# Patient Record
Sex: Male | Born: 1940 | Race: White | Hispanic: Yes | Marital: Married | Smoking: Never smoker
Health system: Southern US, Community
[De-identification: ages and names within clinical notes are randomized; demographics above are authoritative.]

## PROBLEM LIST (undated history)

## (undated) DIAGNOSIS — J387 Other diseases of larynx: Secondary | ICD-10-CM

## (undated) DIAGNOSIS — J42 Unspecified chronic bronchitis: Secondary | ICD-10-CM

## (undated) DIAGNOSIS — I10 Essential (primary) hypertension: Secondary | ICD-10-CM

## (undated) DIAGNOSIS — F329 Major depressive disorder, single episode, unspecified: Secondary | ICD-10-CM

## (undated) DIAGNOSIS — G479 Sleep disorder, unspecified: Secondary | ICD-10-CM

## (undated) DIAGNOSIS — E785 Hyperlipidemia, unspecified: Secondary | ICD-10-CM

## (undated) DIAGNOSIS — K219 Gastro-esophageal reflux disease without esophagitis: Secondary | ICD-10-CM

## (undated) HISTORY — DX: Essential (primary) hypertension: I10

## (undated) HISTORY — DX: Gastro-esophageal reflux disease without esophagitis: K21.9

## (undated) HISTORY — PX: PROSTATE SURGERY: SHX751

## (undated) HISTORY — DX: Hyperlipidemia, unspecified: E78.5

## (undated) HISTORY — DX: Sleep disorder, unspecified: G47.9

## (undated) HISTORY — DX: Unspecified chronic bronchitis: J42

## (undated) HISTORY — PX: KNEE ARTHROSCOPY: SUR90

## (undated) HISTORY — PX: SKIN CANCER EXCISION: SHX779

## (undated) HISTORY — PX: CERVICAL DISCECTOMY: SHX98

## (undated) HISTORY — DX: Other diseases of larynx: J38.7

## (undated) HISTORY — PX: BREAST CYST EXCISION: SHX579

## (undated) HISTORY — DX: Major depressive disorder, single episode, unspecified: F32.9

---

## 1959-05-09 DIAGNOSIS — F32A Depression, unspecified: Secondary | ICD-10-CM

## 1959-05-09 HISTORY — DX: Depression, unspecified: F32.A

## 1960-05-08 HISTORY — PX: TONSILLECTOMY AND ADENOIDECTOMY: SUR1326

## 1960-05-08 HISTORY — PX: ELBOW ARTHROPLASTY: SHX928

## 1996-05-08 HISTORY — PX: NEPHRECTOMY: SHX65

## 2013-06-03 ENCOUNTER — Encounter: Payer: Self-pay | Admitting: Otolaryngology

## 2013-06-08 ENCOUNTER — Encounter: Payer: Self-pay | Admitting: Otolaryngology

## 2013-07-06 ENCOUNTER — Encounter: Payer: Self-pay | Admitting: Otolaryngology

## 2013-07-30 ENCOUNTER — Encounter: Payer: Self-pay | Admitting: Internal Medicine

## 2013-07-30 ENCOUNTER — Ambulatory Visit (INDEPENDENT_AMBULATORY_CARE_PROVIDER_SITE_OTHER): Payer: Self-pay | Admitting: Internal Medicine

## 2013-07-30 VITALS — BP 120/80 | HR 96 | Temp 98.1°F | Ht 68.5 in | Wt 138.0 lb

## 2013-07-30 DIAGNOSIS — E785 Hyperlipidemia, unspecified: Secondary | ICD-10-CM | POA: Insufficient documentation

## 2013-07-30 DIAGNOSIS — K219 Gastro-esophageal reflux disease without esophagitis: Secondary | ICD-10-CM | POA: Insufficient documentation

## 2013-07-30 DIAGNOSIS — R21 Rash and other nonspecific skin eruption: Secondary | ICD-10-CM | POA: Insufficient documentation

## 2013-07-30 DIAGNOSIS — F3289 Other specified depressive episodes: Secondary | ICD-10-CM

## 2013-07-30 DIAGNOSIS — F329 Major depressive disorder, single episode, unspecified: Secondary | ICD-10-CM

## 2013-07-30 DIAGNOSIS — I10 Essential (primary) hypertension: Secondary | ICD-10-CM

## 2013-07-30 DIAGNOSIS — F32A Depression, unspecified: Secondary | ICD-10-CM

## 2013-07-30 DIAGNOSIS — G479 Sleep disorder, unspecified: Secondary | ICD-10-CM | POA: Insufficient documentation

## 2013-07-30 DIAGNOSIS — J42 Unspecified chronic bronchitis: Secondary | ICD-10-CM | POA: Insufficient documentation

## 2013-07-30 MED ORDER — TRIAMCINOLONE ACETONIDE 0.1 % EX CREA: 1.0000 "application " | TOPICAL_CREAM | Freq: Two times a day (BID) | CUTANEOUS | Status: DC | PRN

## 2013-07-30 MED ORDER — TRAZODONE HCL 50 MG PO TABS: 50.0000 mg | ORAL_TABLET | Freq: Every day | ORAL | Status: DC

## 2013-07-30 NOTE — Assessment & Plan Note (Signed)
BP Readings from Last 3 Encounters:  07/30/13 120/80   Repeat 128/88 Will not make any changes

## 2013-07-30 NOTE — Assessment & Plan Note (Addendum)
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

## 2013-07-30 NOTE — Assessment & Plan Note (Addendum)
Has grieving, also just moved back to [place] after living in [place] for 30 years or so Semi retirement Daughter is concerned but he doesn't want meds Sounds like he has some degree of psychomotor agitation

## 2013-07-30 NOTE — Progress Notes (Signed)
Subjective:    Patient ID: Bradley Ellis, male    DOB: September 04, 1940, 73 y.o.   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 Only started with diarrhea in the past couple of days Thinks it is reflux related Was taking prevacid 30 bid in [place] Breathing is fine Trying to start walking now  Has been on lorazepam for many years Chronic sleep problems Arm spasms as well-- per daughter. No tremors though. Was given steroids in past --may have helped. Had conduction tests--?nerve disorder  Has had rash on left arm and leg since back in [place] in December  No current outpatient prescriptions on file prior to visit.   No current facility-administered medications on file prior to visit.    Allergies  Allergen Reactions  . Sulfa Antibiotics Rash    Past Medical History  Diagnosis Date  . Sleep disorder   . Hypertension   . GERD (gastroesophageal reflux disease)   . Chronic bronchitis     not from smoking  . Depression 1961    no major exacerbations since  . Hyperlipidemia     Past Surgical History  Procedure Laterality Date  . Nephrectomy Right 1998    for cancer. Done at [facility]. No other Rx  . Cervical discectomy  [id number]  . Knee arthroscopy Left     2 arthroscopy. Another surgery due to infection (cactus needle)  . Breast cyst excision Bilateral     also in groin and by rectum at other times  . Tonsillectomy and adenoidectomy  1962  . Elbow arthroplasty Left 1962  . Skin cancer excision      several in various places    Family History  Problem Relation Age of Onset  . Cancer Mother     breast cancer  . Heart disease Sister   . Stroke Sister   . Diabetes Sister   . Diabetes Maternal Grandmother     History   Social History  . Marital Status: Widowed    Spouse Name: N/A    Number of Children: 2  . Years of Education: N/A   Occupational History  . [employer]    Social History Main Topics  . Smoking status: Never Smoker   . Smokeless tobacco: Never Used  . Alcohol Use: No  . Drug Use: No  . Sexual Activity: Not on file   Other Topics Concern  . 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       Weight rising since coming back to [place]  Respiratory: Negative for shortness of breath.   Cardiovascular: Positive for chest pain. Negative for palpitations.  Gastrointestinal: Positive for abdominal pain and diarrhea. Negative for nausea, vomiting and blood in stool.       Started with GI symptoms last night  Genitourinary: Positive for difficulty urinating. Negative for dysuria.       Slow initiation  Musculoskeletal: Positive for arthralgias and back pain.  Back surgery in past Occasional knee or wrist pain  Neurological: Negative for dizziness, syncope and light-headedness.  Psychiatric/Behavioral: Positive for sleep disturbance and dysphoric mood. The patient is nervous/anxious.        Mourning his wife-- October 2014       Objective:   Physical Exam  Constitutional: He appears well-developed. No distress.  HENT:  Mouth/Throat: Oropharynx is clear and moist. No oropharyngeal exudate.  Neck: Normal range of motion. Neck supple. No thyromegaly present.  Cardiovascular: Normal rate, regular rhythm, normal heart sounds and intact distal pulses.  Exam reveals no gallop.   No murmur heard. Pulmonary/Chest: Effort normal and breath sounds normal. No respiratory distress. He has  no wheezes. He has no rales.  Abdominal: Soft. There is no tenderness.  Lymphadenopathy:    He has no cervical adenopathy.  Skin:  Blanching mildly tender red rash on left arm and leg--small area overall  Psychiatric:  Mood is neutral Affect is flat Normal appearance          Assessment & Plan:

## 2013-07-30 NOTE — Progress Notes (Signed)
Pre visit review using our clinic review tool, if applicable. No additional management support is needed unless otherwise documented below in the visit note. 

## 2013-07-30 NOTE — Assessment & Plan Note (Signed)
Doesn't look infectious Will try triamcinolone May need derm eval (plans to go when back in [place])

## 2013-07-31 ENCOUNTER — Telehealth: Payer: Self-pay | Admitting: Internal Medicine

## 2013-07-31 NOTE — Telephone Encounter (Signed)
emmi report mailed to patient ° °

## 2013-08-04 ENCOUNTER — Ambulatory Visit (INDEPENDENT_AMBULATORY_CARE_PROVIDER_SITE_OTHER): Payer: Self-pay | Admitting: Internal Medicine

## 2013-08-04 ENCOUNTER — Encounter: Payer: Self-pay | Admitting: Internal Medicine

## 2013-08-04 VITALS — BP 146/100 | HR 98 | Temp 98.3°F | Wt 138.0 lb

## 2013-08-04 DIAGNOSIS — I10 Essential (primary) hypertension: Secondary | ICD-10-CM

## 2013-08-04 MED ORDER — HYDROCHLOROTHIAZIDE 12.5 MG PO CAPS: 12.5000 mg | ORAL_CAPSULE | Freq: Every day | ORAL | Status: DC

## 2013-08-04 NOTE — Assessment & Plan Note (Signed)
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

## 2013-08-04 NOTE — Patient Instructions (Signed)
Please add the hydrochlorothiazide (HCTZ) and continue the other medications. If you check your blood pressure, write down the numbers and bring it with you next time

## 2013-08-04 NOTE — Progress Notes (Signed)
Pre visit review using our clinic review tool, if applicable. No additional management support is needed unless otherwise documented below in the visit note. 

## 2013-08-04 NOTE — Progress Notes (Signed)
Subjective:    Patient ID: Bradley Ellis, male    DOB: May 16, 1940, 73 y.o.   MRN: [id number]  HPI Bradley Ellis to Fire station today Felt a little dizzy and wanted BP checked BP was 170/90 there He called here and got appt  No chest pain Some headache--bandlike across forehead (still some residual) No SOB No change in slight edema  Current Outpatient Prescriptions on File Prior to Visit  Medication Sig Dispense Refill  . amLODipine (NORVASC) 5 MG tablet Take 5 mg by mouth daily.       Marland Kitchen lisinopril (PRINIVIL,ZESTRIL) 40 MG tablet Take 40 mg by mouth daily.       Marland Kitchen omeprazole (PRILOSEC) 40 MG capsule Take 40 mg by mouth daily.       . traZODone (DESYREL) 50 MG tablet Take 1-2 tablets (50-100 mg total) by mouth at bedtime.  60 tablet  11  . triamcinolone cream (KENALOG) 0.1 % Apply 1 application topically 2 (two) times daily as needed.  30 g  0   No current facility-administered medications on file prior to visit.    Allergies  Allergen Reactions  . Sulfa Antibiotics Rash    Past Medical History  Diagnosis Date  . Sleep disorder   . Hypertension   . GERD (gastroesophageal reflux disease)   . Chronic bronchitis     not from smoking  . Depression 1961    no major exacerbations since  . Hyperlipidemia   . Laryngeal nodule     benign    Past Surgical History  Procedure Laterality Date  . Nephrectomy Right 1998    for cancer. Done at [facility]. No other Rx  . Cervical discectomy  [id number]  . Knee arthroscopy Left     2 arthroscopy. Another surgery due to infection (cactus needle)  . Breast cyst excision Bilateral     also in groin and by rectum at other times  . Tonsillectomy and adenoidectomy  1962  . Elbow arthroplasty Left 1962  . Skin cancer excision      several in various places    Family History  Problem Relation Age of Onset  . Cancer Mother     breast cancer  . Heart disease Sister   . Stroke Sister   . Diabetes Sister   . Diabetes Maternal  Grandmother     History   Social History  . Marital Status: Widowed    Spouse Name: N/A    Number of Children: 2  . Years of Education: N/A   Occupational History  . [employer]    Social History Main Topics  . Smoking status: Never Smoker   . Smokeless tobacco: Never Used  . Alcohol Use: No  . Drug Use: No  . Sexual Activity: Not on file   Other Topics Concern  . 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 No distress.  Neck: Normal range of motion. Neck supple. No thyromegaly present.  Cardiovascular: Normal rate, regular rhythm and normal heart sounds.  Exam reveals no gallop.   No murmur heard. Pulmonary/Chest: Effort normal and breath sounds normal. No respiratory distress. He has no wheezes. He has no rales.  Musculoskeletal: He exhibits no edema.  Lymphadenopathy:    He has no cervical adenopathy.          Assessment & Plan:

## 2013-08-05 ENCOUNTER — Other Ambulatory Visit: Payer: Self-pay

## 2013-08-05 MED ORDER — AMLODIPINE BESYLATE 5 MG PO TABS: 5.0000 mg | ORAL_TABLET | Freq: Every day | ORAL | Status: DC

## 2013-08-05 MED ORDER — TRIAMCINOLONE ACETONIDE 0.1 % EX CREA: 1.0000 "application " | TOPICAL_CREAM | Freq: Two times a day (BID) | CUTANEOUS | Status: DC | PRN

## 2013-08-05 MED ORDER — TRAZODONE HCL 50 MG PO TABS: 50.0000 mg | ORAL_TABLET | Freq: Every day | ORAL | Status: DC

## 2013-08-05 MED ORDER — LISINOPRIL 40 MG PO TABS: 40.0000 mg | ORAL_TABLET | Freq: Every day | ORAL | Status: DC

## 2013-08-05 MED ORDER — OMEPRAZOLE 40 MG PO CPDR: 40.0000 mg | DELAYED_RELEASE_CAPSULE | Freq: Every day | ORAL | Status: DC

## 2013-08-05 NOTE — Telephone Encounter (Signed)
[place] said pt is going out of country and request 90 day supply on all meds, amlodipine,lisinopril and omeprazole has previously been prescribed by [facility] office. Please advise.

## 2013-09-10 ENCOUNTER — Ambulatory Visit: Payer: Self-pay | Admitting: Internal Medicine

## 2013-09-11 ENCOUNTER — Ambulatory Visit (INDEPENDENT_AMBULATORY_CARE_PROVIDER_SITE_OTHER): Payer: Self-pay | Admitting: Internal Medicine

## 2013-09-11 ENCOUNTER — Encounter: Payer: Self-pay | Admitting: Internal Medicine

## 2013-09-11 ENCOUNTER — Ambulatory Visit (INDEPENDENT_AMBULATORY_CARE_PROVIDER_SITE_OTHER)
Admission: RE | Admit: 2013-09-11 | Discharge: 2013-09-11 | Disposition: A | Discharge status: Home / Self Care | Payer: Self-pay | Source: Ambulatory Visit | Attending: Internal Medicine | Admitting: Internal Medicine

## 2013-09-11 VITALS — BP 138/80 | HR 83 | Temp 98.4°F | Wt 135.0 lb

## 2013-09-11 DIAGNOSIS — I10 Essential (primary) hypertension: Secondary | ICD-10-CM

## 2013-09-11 DIAGNOSIS — J42 Unspecified chronic bronchitis: Secondary | ICD-10-CM

## 2013-09-11 MED ORDER — AMOXICILLIN 500 MG PO TABS: 1000.0000 mg | ORAL_TABLET | Freq: Two times a day (BID) | ORAL | Status: DC

## 2013-09-11 NOTE — Assessment & Plan Note (Addendum)
Exacerbation now CXR is normal Will treat with amoxil

## 2013-09-11 NOTE — Progress Notes (Signed)
Pre visit review using our clinic review tool, if applicable. No additional management support is needed unless otherwise documented below in the visit note. 

## 2013-09-11 NOTE — Progress Notes (Signed)
Subjective:    Patient ID: Bradley Ellis, male    DOB: 1940/11/08, 73 y.o.   MRN: [id number]  HPI Doing well No problems with the HCTZ Hasn't really been monitoring BP Was okay when back in [place]--- still goes 3 weeks at a time 4 times per year Works at several churches locally  Started with cold 4-5 days ago since return from [place] No fever Some chills without sweats Some SOB--hears rattling in chest Has had some chest pain-- relates to current illness Some sore throat No ear pain No head congestion or drainage  Current Outpatient Prescriptions on File Prior to Visit  Medication Sig Dispense Refill  . amLODipine (NORVASC) 5 MG tablet Take 1 tablet (5 mg total) by mouth daily.  90 tablet  3  . hydrochlorothiazide (MICROZIDE) 12.5 MG capsule Take 1 capsule (12.5 mg total) by mouth daily.  90 capsule  3  . lisinopril (PRINIVIL,ZESTRIL) 40 MG tablet Take 1 tablet (40 mg total) by mouth daily.  90 tablet  3  . omeprazole (PRILOSEC) 40 MG capsule Take 1 capsule (40 mg total) by mouth daily.  90 capsule  3  . traZODone (DESYREL) 50 MG tablet Take 1-2 tablets (50-100 mg total) by mouth at bedtime.  60 tablet  11  . triamcinolone cream (KENALOG) 0.1 % Apply 1 application topically 2 (two) times daily as needed.  30 g  0   No current facility-administered medications on file prior to visit.    Allergies  Allergen Reactions  . Sulfa Antibiotics Rash    Past Medical History  Diagnosis Date  . Sleep disorder   . Hypertension   . GERD (gastroesophageal reflux disease)   . Chronic bronchitis     not from smoking  . Depression 1961    no major exacerbations since  . Hyperlipidemia   . Laryngeal nodule     benign    Past Surgical History  Procedure Laterality Date  . Nephrectomy Right 1998    for cancer. Done at [facility]. No other Rx  . Cervical discectomy  [id number]  . Knee arthroscopy Left     2 arthroscopy. Another surgery due to infection (cactus needle)  .  Breast cyst excision Bilateral     also in groin and by rectum at other times  . Tonsillectomy and adenoidectomy  1962  . Elbow arthroplasty Left 1962  . Skin cancer excision      several in various places    Family History  Problem Relation Age of Onset  . Cancer Mother     breast cancer  . Heart disease Sister   . Stroke Sister   . Diabetes Sister   . Diabetes Maternal Grandmother     History   Social History  . Marital Status: Widowed    Spouse Name: N/A    Number of Children: 2  . Years of Education: N/A   Occupational History  . [employer]    Social History Main Topics  . Smoking status: Never Smoker   . Smokeless tobacco: Never Used  . Alcohol Use: No  . Drug Use: No  . Sexual Activity: Not on file   Other Topics Concern  . 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 Some loose stools Not eating  well-- a little better today    Objective:   Physical Exam  Constitutional: He appears well-developed and well-nourished. No distress.  Coarse cough  HENT:  No sinus tenderness TMs normal Mild nasal congestion Injection of pharynx without exudate  Neck: Normal range of motion. Neck supple. No thyromegaly present.  Cardiovascular: Normal rate, regular rhythm and normal heart sounds.  Exam reveals no gallop.   No murmur heard. Pulmonary/Chest: Effort normal. No respiratory distress. He has no wheezes.  No dullness Mild bibasilar crackles  Musculoskeletal: He exhibits no edema and no tenderness.  Lymphadenopathy:    He has no cervical adenopathy.          Assessment & Plan:

## 2013-09-11 NOTE — Assessment & Plan Note (Signed)
BP Readings from Last 3 Encounters:  09/11/13 138/80  08/04/13 146/100  07/30/13 120/80   Better control Will check labs No changes

## 2013-09-12 ENCOUNTER — Encounter: Payer: Self-pay | Admitting: *Deleted

## 2013-09-12 LAB — BASIC METABOLIC PANEL
BUN: 11 mg/dL (ref 6–23)
CO2: 30 meq/L (ref 19–32)
CREATININE: 1.3 mg/dL (ref 0.4–1.5)
Calcium: 8.6 mg/dL (ref 8.4–10.5)
Chloride: 104 mEq/L (ref 96–112)
GFR: 57.23 mL/min — ABNORMAL LOW (ref >60.00)
Glucose, Bld: 72 mg/dL (ref 70–99)
POTASSIUM: 4.2 meq/L (ref 3.5–5.1)
Sodium: 142 mEq/L (ref 135–145)

## 2013-09-15 ENCOUNTER — Telehealth: Payer: Self-pay

## 2013-09-15 MED ORDER — BECLOMETHASONE DIPROPIONATE 40 MCG/ACT IN AERS: 1.0000 | INHALATION_SPRAY | Freq: Two times a day (BID) | RESPIRATORY_TRACT | Status: DC

## 2013-09-15 NOTE — Telephone Encounter (Signed)
rx sent to pharmacy by e-script Spoke with patient and advised results  he has never used albuterol

## 2013-09-15 NOTE — Telephone Encounter (Signed)
Okay to send Qvar 40 1 inhalation bid---use with spacer (can get OTC) and rinse after #1 x 11  Ask if he has used albuterol If yes, can send #1 x 1 2 inhalations tid prn

## 2013-09-15 NOTE — Telephone Encounter (Signed)
Pt left v/m; pt was seen on 09/11/13 and pt was calling back, bronchitis is no better; pt said x 2 pt could not get his breath on 09/13/13 and 09/14/13 and pt used someone elses inhaler and that helped pt to breathe. Pt request inhaler sent to [facility] [address]. Spoke with pt and inhaler that pt used was Qvar 40 mg. Pt is still taking antibiotic and pt feels little better today. Pt wants cb when inhaler called in.

## 2013-09-22 ENCOUNTER — Telehealth: Payer: Self-pay

## 2013-09-22 NOTE — Telephone Encounter (Signed)
Spoke with patient and advised results   

## 2013-09-22 NOTE — Telephone Encounter (Signed)
Pt received letter and request further clarification of what mild hyperinflation is suspicious of COPD means. Pt request cb.

## 2013-09-22 NOTE — Telephone Encounter (Signed)
It means that the amount of air in his lungs is more than expected. That can happen with any form of chronic lung problems (like his chronic bronchitis)

## 2014-02-04 ENCOUNTER — Emergency Department: Payer: Self-pay | Admitting: Emergency Medicine

## 2014-02-04 ENCOUNTER — Telehealth: Payer: Self-pay

## 2014-02-04 NOTE — Telephone Encounter (Signed)
Pt is going out of town on 02/05/14 and wants refills on all meds; advised pt [facility] should have available refills and pt will contact pharmacy.

## 2014-09-14 ENCOUNTER — Encounter: Payer: Self-pay | Admitting: Internal Medicine

## 2014-09-14 DIAGNOSIS — Z0289 Encounter for other administrative examinations: Secondary | ICD-10-CM

## 2014-11-18 DIAGNOSIS — F325 Major depressive disorder, single episode, in full remission: Secondary | ICD-10-CM | POA: Insufficient documentation

## 2014-11-19 DIAGNOSIS — M79604 Pain in right leg: Secondary | ICD-10-CM | POA: Insufficient documentation

## 2014-11-19 DIAGNOSIS — M79605 Pain in left leg: Secondary | ICD-10-CM | POA: Insufficient documentation

## 2014-11-23 ENCOUNTER — Other Ambulatory Visit: Payer: Self-pay | Admitting: Internal Medicine

## 2014-11-24 ENCOUNTER — Other Ambulatory Visit: Payer: Self-pay | Admitting: *Deleted

## 2014-11-24 MED ORDER — OMEPRAZOLE 40 MG PO CPDR
40.0000 mg | DELAYED_RELEASE_CAPSULE | Freq: Every day | ORAL | Status: DC
Start: 2014-11-24 — End: 2018-04-09

## 2014-11-24 NOTE — Telephone Encounter (Signed)
Faxed refill request. Patient not seen since 09/11/13.  No showed for appt on 09/12/14.  Last Filled:    90 capsule 3 RF on 08/05/2013  Please advise.

## 2014-11-24 NOTE — Telephone Encounter (Signed)
rx sent to pharmacy by e-script  

## 2017-01-15 DIAGNOSIS — F5101 Primary insomnia: Secondary | ICD-10-CM | POA: Diagnosis not present

## 2017-01-15 DIAGNOSIS — N529 Male erectile dysfunction, unspecified: Secondary | ICD-10-CM | POA: Diagnosis not present

## 2017-02-14 DIAGNOSIS — N529 Male erectile dysfunction, unspecified: Secondary | ICD-10-CM | POA: Diagnosis not present

## 2017-03-06 DIAGNOSIS — H6120 Impacted cerumen, unspecified ear: Secondary | ICD-10-CM | POA: Diagnosis not present

## 2017-03-13 DIAGNOSIS — H2512 Age-related nuclear cataract, left eye: Secondary | ICD-10-CM | POA: Diagnosis not present

## 2017-03-13 DIAGNOSIS — H2511 Age-related nuclear cataract, right eye: Secondary | ICD-10-CM | POA: Diagnosis not present

## 2017-03-13 DIAGNOSIS — H02889 Meibomian gland dysfunction of unspecified eye, unspecified eyelid: Secondary | ICD-10-CM | POA: Diagnosis not present

## 2017-03-13 DIAGNOSIS — H02831 Dermatochalasis of right upper eyelid: Secondary | ICD-10-CM | POA: Diagnosis not present

## 2017-03-14 DIAGNOSIS — R351 Nocturia: Secondary | ICD-10-CM | POA: Diagnosis not present

## 2017-03-14 DIAGNOSIS — N401 Enlarged prostate with lower urinary tract symptoms: Secondary | ICD-10-CM | POA: Diagnosis not present

## 2017-03-14 DIAGNOSIS — M6281 Muscle weakness (generalized): Secondary | ICD-10-CM | POA: Diagnosis not present

## 2017-03-14 DIAGNOSIS — R531 Weakness: Secondary | ICD-10-CM | POA: Diagnosis not present

## 2017-03-15 DIAGNOSIS — H2512 Age-related nuclear cataract, left eye: Secondary | ICD-10-CM | POA: Diagnosis not present

## 2017-03-21 DIAGNOSIS — H2511 Age-related nuclear cataract, right eye: Secondary | ICD-10-CM | POA: Diagnosis not present

## 2017-03-21 DIAGNOSIS — H25811 Combined forms of age-related cataract, right eye: Secondary | ICD-10-CM | POA: Diagnosis not present

## 2018-02-13 DIAGNOSIS — I10 Essential (primary) hypertension: Secondary | ICD-10-CM | POA: Diagnosis not present

## 2018-02-13 DIAGNOSIS — J069 Acute upper respiratory infection, unspecified: Secondary | ICD-10-CM | POA: Diagnosis not present

## 2018-02-13 DIAGNOSIS — K219 Gastro-esophageal reflux disease without esophagitis: Secondary | ICD-10-CM | POA: Diagnosis not present

## 2018-02-22 DIAGNOSIS — Z23 Encounter for immunization: Secondary | ICD-10-CM | POA: Diagnosis not present

## 2018-03-15 DIAGNOSIS — G4700 Insomnia, unspecified: Secondary | ICD-10-CM | POA: Diagnosis not present

## 2018-03-15 DIAGNOSIS — I10 Essential (primary) hypertension: Secondary | ICD-10-CM | POA: Diagnosis not present

## 2018-03-15 DIAGNOSIS — Z760 Encounter for issue of repeat prescription: Secondary | ICD-10-CM | POA: Diagnosis not present

## 2018-03-20 DIAGNOSIS — G4700 Insomnia, unspecified: Secondary | ICD-10-CM | POA: Diagnosis not present

## 2018-03-20 DIAGNOSIS — I10 Essential (primary) hypertension: Secondary | ICD-10-CM | POA: Diagnosis not present

## 2018-03-20 DIAGNOSIS — G589 Mononeuropathy, unspecified: Secondary | ICD-10-CM | POA: Diagnosis not present

## 2018-03-28 DIAGNOSIS — I10 Essential (primary) hypertension: Secondary | ICD-10-CM | POA: Diagnosis not present

## 2018-03-28 DIAGNOSIS — G5793 Unspecified mononeuropathy of bilateral lower limbs: Secondary | ICD-10-CM | POA: Diagnosis not present

## 2018-03-28 DIAGNOSIS — Z760 Encounter for issue of repeat prescription: Secondary | ICD-10-CM | POA: Diagnosis not present

## 2018-04-01 DIAGNOSIS — M545 Low back pain: Secondary | ICD-10-CM | POA: Diagnosis not present

## 2018-04-01 DIAGNOSIS — Z5181 Encounter for therapeutic drug level monitoring: Secondary | ICD-10-CM | POA: Diagnosis not present

## 2018-04-03 DIAGNOSIS — I714 Abdominal aortic aneurysm, without rupture, unspecified: Secondary | ICD-10-CM | POA: Insufficient documentation

## 2018-04-03 DIAGNOSIS — Z905 Acquired absence of kidney: Secondary | ICD-10-CM | POA: Diagnosis not present

## 2018-04-03 DIAGNOSIS — R109 Unspecified abdominal pain: Secondary | ICD-10-CM | POA: Diagnosis not present

## 2018-04-03 DIAGNOSIS — I318 Other specified diseases of pericardium: Secondary | ICD-10-CM | POA: Diagnosis not present

## 2018-04-03 DIAGNOSIS — R1013 Epigastric pain: Secondary | ICD-10-CM | POA: Diagnosis not present

## 2018-04-03 DIAGNOSIS — K5730 Diverticulosis of large intestine without perforation or abscess without bleeding: Secondary | ICD-10-CM | POA: Diagnosis not present

## 2018-04-03 DIAGNOSIS — N2889 Other specified disorders of kidney and ureter: Secondary | ICD-10-CM | POA: Diagnosis not present

## 2018-04-03 DIAGNOSIS — K859 Acute pancreatitis without necrosis or infection, unspecified: Secondary | ICD-10-CM | POA: Diagnosis not present

## 2018-04-03 DIAGNOSIS — I480 Paroxysmal atrial fibrillation: Secondary | ICD-10-CM | POA: Insufficient documentation

## 2018-04-03 DIAGNOSIS — I4510 Unspecified right bundle-branch block: Secondary | ICD-10-CM | POA: Diagnosis not present

## 2018-04-03 DIAGNOSIS — R1314 Dysphagia, pharyngoesophageal phase: Secondary | ICD-10-CM | POA: Diagnosis not present

## 2018-04-03 DIAGNOSIS — I4891 Unspecified atrial fibrillation: Secondary | ICD-10-CM | POA: Diagnosis not present

## 2018-04-03 DIAGNOSIS — K269 Duodenal ulcer, unspecified as acute or chronic, without hemorrhage or perforation: Secondary | ICD-10-CM | POA: Diagnosis not present

## 2018-04-03 DIAGNOSIS — K901 Tropical sprue: Secondary | ICD-10-CM | POA: Diagnosis not present

## 2018-04-03 DIAGNOSIS — I4892 Unspecified atrial flutter: Secondary | ICD-10-CM | POA: Diagnosis not present

## 2018-04-03 DIAGNOSIS — I445 Left posterior fascicular block: Secondary | ICD-10-CM | POA: Diagnosis not present

## 2018-04-03 DIAGNOSIS — I878 Other specified disorders of veins: Secondary | ICD-10-CM | POA: Diagnosis not present

## 2018-04-03 DIAGNOSIS — I700 Atherosclerosis of aorta: Secondary | ICD-10-CM | POA: Diagnosis not present

## 2018-04-03 DIAGNOSIS — K222 Esophageal obstruction: Secondary | ICD-10-CM | POA: Diagnosis not present

## 2018-04-04 DIAGNOSIS — Z905 Acquired absence of kidney: Secondary | ICD-10-CM | POA: Diagnosis not present

## 2018-04-04 DIAGNOSIS — I10 Essential (primary) hypertension: Secondary | ICD-10-CM | POA: Diagnosis not present

## 2018-04-04 DIAGNOSIS — I714 Abdominal aortic aneurysm, without rupture: Secondary | ICD-10-CM | POA: Diagnosis not present

## 2018-04-04 DIAGNOSIS — Z8673 Personal history of transient ischemic attack (TIA), and cerebral infarction without residual deficits: Secondary | ICD-10-CM | POA: Diagnosis not present

## 2018-04-04 DIAGNOSIS — I319 Disease of pericardium, unspecified: Secondary | ICD-10-CM | POA: Diagnosis not present

## 2018-04-04 DIAGNOSIS — R634 Abnormal weight loss: Secondary | ICD-10-CM | POA: Diagnosis not present

## 2018-04-04 DIAGNOSIS — R1013 Epigastric pain: Secondary | ICD-10-CM | POA: Diagnosis not present

## 2018-04-04 DIAGNOSIS — Q248 Other specified congenital malformations of heart: Secondary | ICD-10-CM | POA: Diagnosis not present

## 2018-04-04 DIAGNOSIS — K3189 Other diseases of stomach and duodenum: Secondary | ICD-10-CM | POA: Diagnosis not present

## 2018-04-04 DIAGNOSIS — I4892 Unspecified atrial flutter: Secondary | ICD-10-CM | POA: Diagnosis not present

## 2018-04-04 DIAGNOSIS — I445 Left posterior fascicular block: Secondary | ICD-10-CM | POA: Diagnosis not present

## 2018-04-04 DIAGNOSIS — K8500 Idiopathic acute pancreatitis without necrosis or infection: Secondary | ICD-10-CM | POA: Diagnosis not present

## 2018-04-04 DIAGNOSIS — K222 Esophageal obstruction: Secondary | ICD-10-CM | POA: Diagnosis not present

## 2018-04-04 DIAGNOSIS — K269 Duodenal ulcer, unspecified as acute or chronic, without hemorrhage or perforation: Secondary | ICD-10-CM | POA: Diagnosis present

## 2018-04-04 DIAGNOSIS — E538 Deficiency of other specified B group vitamins: Secondary | ICD-10-CM | POA: Diagnosis present

## 2018-04-04 DIAGNOSIS — Z85528 Personal history of other malignant neoplasm of kidney: Secondary | ICD-10-CM | POA: Diagnosis not present

## 2018-04-04 DIAGNOSIS — R109 Unspecified abdominal pain: Secondary | ICD-10-CM | POA: Diagnosis not present

## 2018-04-04 DIAGNOSIS — I4510 Unspecified right bundle-branch block: Secondary | ICD-10-CM | POA: Diagnosis not present

## 2018-04-04 DIAGNOSIS — K2980 Duodenitis without bleeding: Secondary | ICD-10-CM | POA: Diagnosis not present

## 2018-04-04 DIAGNOSIS — K2970 Gastritis, unspecified, without bleeding: Secondary | ICD-10-CM | POA: Diagnosis not present

## 2018-04-04 DIAGNOSIS — I361 Nonrheumatic tricuspid (valve) insufficiency: Secondary | ICD-10-CM | POA: Diagnosis not present

## 2018-04-04 DIAGNOSIS — K2981 Duodenitis with bleeding: Secondary | ICD-10-CM | POA: Diagnosis not present

## 2018-04-04 DIAGNOSIS — I739 Peripheral vascular disease, unspecified: Secondary | ICD-10-CM | POA: Diagnosis present

## 2018-04-04 DIAGNOSIS — I4891 Unspecified atrial fibrillation: Secondary | ICD-10-CM | POA: Diagnosis not present

## 2018-04-04 DIAGNOSIS — R197 Diarrhea, unspecified: Secondary | ICD-10-CM | POA: Diagnosis not present

## 2018-04-04 DIAGNOSIS — I340 Nonrheumatic mitral (valve) insufficiency: Secondary | ICD-10-CM | POA: Diagnosis not present

## 2018-04-04 DIAGNOSIS — R1310 Dysphagia, unspecified: Secondary | ICD-10-CM | POA: Diagnosis not present

## 2018-04-04 DIAGNOSIS — R1314 Dysphagia, pharyngoesophageal phase: Secondary | ICD-10-CM | POA: Diagnosis present

## 2018-04-04 DIAGNOSIS — I517 Cardiomegaly: Secondary | ICD-10-CM | POA: Diagnosis not present

## 2018-04-04 DIAGNOSIS — K901 Tropical sprue: Secondary | ICD-10-CM | POA: Diagnosis present

## 2018-04-04 DIAGNOSIS — K253 Acute gastric ulcer without hemorrhage or perforation: Secondary | ICD-10-CM | POA: Diagnosis not present

## 2018-04-04 DIAGNOSIS — G4730 Sleep apnea, unspecified: Secondary | ICD-10-CM | POA: Diagnosis present

## 2018-04-04 DIAGNOSIS — K859 Acute pancreatitis without necrosis or infection, unspecified: Secondary | ICD-10-CM | POA: Diagnosis not present

## 2018-04-04 DIAGNOSIS — I480 Paroxysmal atrial fibrillation: Secondary | ICD-10-CM | POA: Diagnosis not present

## 2018-04-09 ENCOUNTER — Ambulatory Visit (INDEPENDENT_AMBULATORY_CARE_PROVIDER_SITE_OTHER): Payer: Medicare Other | Admitting: Family Medicine

## 2018-04-09 ENCOUNTER — Encounter: Payer: Self-pay | Admitting: Family Medicine

## 2018-04-09 VITALS — BP 122/68 | HR 69 | Temp 97.8°F | Resp 14 | Ht 68.0 in | Wt 142.6 lb

## 2018-04-09 DIAGNOSIS — F325 Major depressive disorder, single episode, in full remission: Secondary | ICD-10-CM

## 2018-04-09 DIAGNOSIS — I480 Paroxysmal atrial fibrillation: Secondary | ICD-10-CM

## 2018-04-09 DIAGNOSIS — F5101 Primary insomnia: Secondary | ICD-10-CM | POA: Diagnosis not present

## 2018-04-09 DIAGNOSIS — I714 Abdominal aortic aneurysm, without rupture, unspecified: Secondary | ICD-10-CM

## 2018-04-09 DIAGNOSIS — K2980 Duodenitis without bleeding: Secondary | ICD-10-CM | POA: Diagnosis not present

## 2018-04-09 DIAGNOSIS — J410 Simple chronic bronchitis: Secondary | ICD-10-CM | POA: Diagnosis not present

## 2018-04-09 DIAGNOSIS — Z09 Encounter for follow-up examination after completed treatment for conditions other than malignant neoplasm: Secondary | ICD-10-CM

## 2018-04-09 DIAGNOSIS — K269 Duodenal ulcer, unspecified as acute or chronic, without hemorrhage or perforation: Secondary | ICD-10-CM | POA: Insufficient documentation

## 2018-04-09 DIAGNOSIS — G629 Polyneuropathy, unspecified: Secondary | ICD-10-CM | POA: Insufficient documentation

## 2018-04-09 DIAGNOSIS — I10 Essential (primary) hypertension: Secondary | ICD-10-CM | POA: Diagnosis not present

## 2018-04-09 DIAGNOSIS — K8500 Idiopathic acute pancreatitis without necrosis or infection: Secondary | ICD-10-CM | POA: Diagnosis not present

## 2018-04-09 DIAGNOSIS — K319 Disease of stomach and duodenum, unspecified: Secondary | ICD-10-CM | POA: Diagnosis not present

## 2018-04-09 DIAGNOSIS — K859 Acute pancreatitis without necrosis or infection, unspecified: Secondary | ICD-10-CM | POA: Insufficient documentation

## 2018-04-09 LAB — CBC WITH DIFFERENTIAL/PLATELET
Basophils Absolute: 62 cells/uL (ref 0–200)
Basophils Relative: 0.6 %
Eosinophils Absolute: 134 cells/uL (ref 15–500)
Eosinophils Relative: 1.3 %
HEMATOCRIT: 49.1 % (ref 38.5–50.0)
Hemoglobin: 16.1 g/dL (ref 13.2–17.1)
LYMPHS ABS: 2709 {cells}/uL (ref 850–3900)
MCH: 29.1 pg (ref 27.0–33.0)
MCHC: 32.8 g/dL (ref 32.0–36.0)
MCV: 88.6 fL (ref 80.0–100.0)
MPV: 11.7 fL (ref 7.5–12.5)
Monocytes Relative: 9.2 %
Neutro Abs: 6448 cells/uL (ref 1500–7800)
Neutrophils Relative %: 62.6 %
Platelets: 308 10*3/uL (ref 140–400)
RBC: 5.54 10*6/uL (ref 4.20–5.80)
RDW: 13.1 % (ref 11.0–15.0)
Total Lymphocyte: 26.3 %
WBC mixed population: 948 cells/uL (ref 200–950)
WBC: 10.3 10*3/uL (ref 3.8–10.8)

## 2018-04-09 LAB — COMPLETE METABOLIC PANEL WITH GFR
AG Ratio: 1.7 (calc) (ref 1.0–2.5)
ALBUMIN MSPROF: 4.3 g/dL (ref 3.6–5.1)
ALT: 20 U/L (ref 9–46)
AST: 20 U/L (ref 10–35)
Alkaline phosphatase (APISO): 83 U/L (ref 40–115)
BUN/Creatinine Ratio: 23 (calc) — ABNORMAL HIGH (ref 6–22)
BUN: 38 mg/dL — AB (ref 7–25)
CO2: 30 mmol/L (ref 20–32)
CREATININE: 1.66 mg/dL — AB (ref 0.70–1.18)
Calcium: 9.5 mg/dL (ref 8.6–10.3)
Chloride: 100 mmol/L (ref 98–110)
GFR, Est African American: 45 mL/min/{1.73_m2} — ABNORMAL LOW (ref >=60)
GFR, Est Non African American: 39 mL/min/{1.73_m2} — ABNORMAL LOW (ref >=60)
GLUCOSE: 89 mg/dL (ref 65–99)
Globulin: 2.5 g/dL (calc) (ref 1.9–3.7)
Potassium: 4.2 mmol/L (ref 3.5–5.3)
Sodium: 142 mmol/L (ref 135–146)
Total Bilirubin: 0.6 mg/dL (ref 0.2–1.2)
Total Protein: 6.8 g/dL (ref 6.1–8.1)

## 2018-04-09 LAB — LIPASE: Lipase: 29 U/L (ref 7–60)

## 2018-04-09 MED ORDER — TRAZODONE HCL 50 MG PO TABS: 50.0000 mg | ORAL_TABLET | Freq: Every day | ORAL | 0 refills | Status: DC

## 2018-04-09 MED ORDER — CANDESARTAN CILEXETIL 32 MG PO TABS: 32.0000 mg | ORAL_TABLET | Freq: Every day | ORAL | 0 refills | Status: DC

## 2018-04-09 MED ORDER — HYDROCHLOROTHIAZIDE 12.5 MG PO TABS: 12.5000 mg | ORAL_TABLET | Freq: Every day | ORAL | 0 refills | Status: DC

## 2018-04-09 NOTE — Progress Notes (Signed)
Name: Bradley Ellis   MRN: [id number]    DOB: Jan 26, 1941   Date:04/09/2018       Progress Note  Subjective  Chief Complaint  Chief Complaint  Patient presents with  . Establish Care  . Diarrhea    patient stated that he has periodic episodes of diarrhea  . Numbness    patient stated that he had some numbness and pain going down the right arm for about 1/2 hour last night    HPI  Pancreatitis/duodenal Ulcer: he has a long history of reflux but was without medications for years, last week he developed acute onset of epigastric pain, nausea, regurgitation. He had noticed lack of appetite for the previous 2 weeks and weight loss of about 15 lbs before going to [facility]. 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 He had afib that was brief . He was sent home with pain medication and bp medication, PPI BID . He states he is feeling better, gained 2 lbs since discharged, and no abdominal pain.   Neuropathy: going on for years, took another medication previously, he is now on gabapentin and seems to be helping, pain is 1/10  [place]: diagnosed by [facility] [place] [facility] many years ago, he was on a strict diet, states still has episodes of diarrhea. We will refer to GI    Patient Active Problem List   Diagnosis Date Noted  . Pancreatitis, acute 04/09/2018  . Duodenal ulcer without hemorrhage or perforation 04/09/2018  . Neuropathy 04/09/2018  . Paroxysmal A-fib ([place]) 04/03/2018  . Aneurysm of abdominal aorta ([facility]) 04/03/2018  . Pain in both lower extremities 11/19/2014  . Major depression in remission ([place]) 11/18/2014  . Sleep disorder   . Hypertension   . GERD (gastroesophageal reflux disease)   . Chronic bronchitis ([place])   .  Hyperlipidemia     Past Surgical History:  Procedure Laterality Date  . BREAST CYST EXCISION Bilateral    also in groin and by rectum at other times (7)  . CERVICAL DISCECTOMY  [id number]  . ELBOW ARTHROPLASTY Left 1962  . KNEE ARTHROSCOPY Left    2 arthroscopy. Another surgery due to infection (cactus needle)  . NEPHRECTOMY Right 1998   for cancer. Done at [facility]. No other Rx  . PROSTATE SURGERY     about 15 yrs ago  . SKIN CANCER EXCISION     several in various places  . TONSILLECTOMY AND ADENOIDECTOMY  1962    Family History  Problem Relation Age of Onset  . Cancer Mother        breast cancer  . Heart disease Sister   . Stroke Sister   . Diabetes Sister   . Diabetes Maternal Grandmother     Social History   Socioeconomic History  . Marital status: Married    Spouse name: Salena Saner  . Number of children: 2  . Years of education: Not on file  . Highest education level: Some college, no degree  Occupational History  . Occupation: [employer]  . Financial resource strain: Somewhat hard  . Food insecurity:    Worry: Never true    Inability: Never true  . Transportation needs:    Medical: No  Non-medical: No  Tobacco Use  . Smoking status: Never Smoker  . Smokeless tobacco: Never Used  Substance and Sexual Activity  . Alcohol use: No  . Drug use: No  . Sexual activity: Not on file  Lifestyle  . Physical activity:    Days per week: 0 days    Minutes per session: 0 min  . Stress: Rather much  Relationships  . Social connections:    Talks on phone: More than three times a week    Gets together: More than three times a week    Attends religious service: More than 4 times per year    Active member of club or organization: Yes    Attends meetings of clubs or organizations: More than 4 times per year    Relationship status: Married  . Intimate partner violence:    Fear of current or ex partner: No    Emotionally abused: No     Physically abused: No    Forced sexual activity: No  Other Topics Concern  . 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  aspirin EC 81 MG tablet, Take 81 mg by mouth daily., Disp: , Rfl:  .  hydrochlorothiazide (HYDRODIURIL) 12.5 MG tablet, Take 1 tablet (12.5 mg total) by mouth daily., Disp: 30 tablet, Rfl: 0 .  metoprolol tartrate (LOPRESSOR) 100 MG tablet, Take 100 mg by mouth 2 (two) times daily., Disp: , Rfl:  .  pantoprazole (PROTONIX) 40 MG tablet, Take 40 mg by mouth 2 (two) times daily., Disp: , Rfl:  .  candesartan (ATACAND) 32 MG tablet, Take 1 tablet (32 mg total) by mouth daily., Disp: 30 tablet, Rfl: 0 .  gabapentin (NEURONTIN) 300 MG capsule, Take 300 mg by mouth daily., Disp: , Rfl: 0 .  traZODone (DESYREL) 50 MG tablet, Take 1 tablet (50 mg total) by mouth at bedtime., Disp: 30 tablet, Rfl: 0  Allergies  Allergen Reactions  . Sulfa Antibiotics Rash    I personally reviewed active problem list, medication list, allergies, family history, social history with the patient/caregiver today.   ROS  Constitutional: Negative for fever, but had recent  weight change.  Respiratory: Negative for cough and shortness of breath.   Cardiovascular: Negative for chest pain or palpitations.  Gastrointestinal: positive  for abdominal pain, also has diarrhea daily , takes otc medication - that is chronic  Musculoskeletal: Negative for gait problem or joint swelling.  Skin: Negative for rash.  Neurological: Negative for dizziness or headache.  No other specific complaints in a complete review of systems (except as listed in HPI above).  Objective  Vitals:   04/09/18 1330  BP: 122/68  Pulse: 69  Resp: 14  Temp: 97.8 F (36.6 C)  TempSrc: Oral  SpO2: 99%  Weight: 142 lb 9.6 oz (64.7 kg)   Height: 5\' 8"  (1.727 m)    Body mass index is 21.68 kg/m.  Physical Exam   Constitutional: Patient appears well-developed and well-nourished.  No distress.  HEENT: head atraumatic, normocephalic, pupils equal and reactive to light,neck supple, throat within normal limits Cardiovascular: Normal rate, regular rhythm and normal heart sounds.  No murmur heard. No BLE edema. Pulmonary/Chest: Effort normal and breath sounds normal. No respiratory distress. Abdominal: Soft.  There is epigastric tenderness, normal bowel  sounds  Psychiatric: Patient has a normal mood and affect. behavior is normal. Judgment and thought content normal.  PHQ2/9: Depression screen PHQ 2/9 04/09/2018  Decreased Interest 0  Down, Depressed, Hopeless 0  PHQ - 2 Score 0  Altered sleeping 3  Tired, decreased energy 3  Change in appetite 3  Feeling bad or failure about yourself  0  Trouble concentrating 0  Moving slowly or fidgety/restless 0  Suicidal thoughts 0  PHQ-9 Score 9  Difficult doing work/chores Not difficult at all     Fall Risk: Fall Risk  04/09/2018  Falls in the past year? 0  Number falls in past yr: 0  Injury with Fall? 0     Functional Status Survey: Is the patient deaf or have difficulty hearing?: Yes Does the patient have difficulty seeing, even when wearing glasses/contacts?: No Does the patient have difficulty concentrating, remembering, or making decisions?: No Does the patient have difficulty walking or climbing stairs?: No Does the patient have difficulty dressing or bathing?: No Does the patient have difficulty doing errands alone such as visiting a doctor's office or shopping?: No   Assessment & Plan  1. Idiopathic acute pancreatitis without infection or necrosis  - COMPLETE METABOLIC PANEL WITH GFR Per CT image, lipase was normal.   2. Hospital discharge follow-up  - COMPLETE METABOLIC PANEL WITH GFR - CBC with Differential/Platelet  3. Major depression in  remission [facility])  Doing well   4. Paroxysmal A-fib ([place])  During recent hospital stay , on aspirin, we will wait for records to arrive  5. Duodenal ulcer without hemorrhage or perforation  - CBC with Differential/Platelet  6. Abdominal aortic aneurysm (AAA) without rupture [facility])  Referral placed to vascular surgeon   7. Neuropathy  On gabapentin we will wait to see if he had B12 levels checked during hospital stay   8. Simple chronic bronchitis ([place])  He states off medication and doing well at this time  9. Hypertension, benign  BP is towards low end of normal, his medication was adjusted during hospital stay but he states had to skip medication this am because bp was low. We will decreased HCTZ from 50 to 12.5 mg - COMPLETE METABOLIC PANEL WITH GFR - CBC with Differential/Platelet - hydrochlorothiazide (HYDRODIURIL) 12.5 MG tablet; Take 1 tablet (12.5 mg total) by mouth daily.  Dispense: 30 tablet; Refill: 0 - candesartan (ATACAND) 32 MG tablet; Take 1 tablet (32 mg total) by mouth daily.  Dispense: 30 tablet; Refill: 0  10. Primary insomnia  - traZODone (DESYREL) 50 MG tablet; Take 1 tablet (50 mg total) by mouth at bedtime.  Dispense: 30 tablet; Refill: 0

## 2018-04-10 ENCOUNTER — Ambulatory Visit: Payer: Self-pay

## 2018-04-10 NOTE — Telephone Encounter (Signed)
Attempted to contact pt; pre-recorded message states that the caller is not accepting calls; unable to leave message; pt last seen in office 04/09/18; will route to office for final disposition.

## 2018-04-10 NOTE — Telephone Encounter (Signed)
Patient called, automated message said patient is not accepting calls at this time, try the call later.

## 2018-04-15 ENCOUNTER — Encounter: Payer: Self-pay | Admitting: *Deleted

## 2018-04-17 ENCOUNTER — Encounter: Payer: Self-pay | Admitting: Family Medicine

## 2018-04-17 DIAGNOSIS — I5189 Other ill-defined heart diseases: Secondary | ICD-10-CM | POA: Insufficient documentation

## 2018-04-17 DIAGNOSIS — I340 Nonrheumatic mitral (valve) insufficiency: Secondary | ICD-10-CM | POA: Insufficient documentation

## 2018-04-17 DIAGNOSIS — I071 Rheumatic tricuspid insufficiency: Secondary | ICD-10-CM | POA: Insufficient documentation

## 2018-04-29 ENCOUNTER — Ambulatory Visit (INDEPENDENT_AMBULATORY_CARE_PROVIDER_SITE_OTHER): Payer: Medicare Other | Admitting: Family Medicine

## 2018-04-29 ENCOUNTER — Encounter: Payer: Self-pay | Admitting: Family Medicine

## 2018-04-29 VITALS — BP 120/80 | HR 70 | Temp 98.0°F | Resp 16 | Ht 68.0 in | Wt 136.2 lb

## 2018-04-29 DIAGNOSIS — I480 Paroxysmal atrial fibrillation: Secondary | ICD-10-CM | POA: Diagnosis not present

## 2018-04-29 DIAGNOSIS — J410 Simple chronic bronchitis: Secondary | ICD-10-CM

## 2018-04-29 DIAGNOSIS — I10 Essential (primary) hypertension: Secondary | ICD-10-CM | POA: Diagnosis not present

## 2018-04-29 DIAGNOSIS — N183 Chronic kidney disease, stage 3 unspecified: Secondary | ICD-10-CM

## 2018-04-29 DIAGNOSIS — I714 Abdominal aortic aneurysm, without rupture, unspecified: Secondary | ICD-10-CM

## 2018-04-29 DIAGNOSIS — Z905 Acquired absence of kidney: Secondary | ICD-10-CM

## 2018-04-29 DIAGNOSIS — Z85528 Personal history of other malignant neoplasm of kidney: Secondary | ICD-10-CM | POA: Diagnosis not present

## 2018-04-29 DIAGNOSIS — F5101 Primary insomnia: Secondary | ICD-10-CM

## 2018-04-29 DIAGNOSIS — E441 Mild protein-calorie malnutrition: Secondary | ICD-10-CM | POA: Diagnosis not present

## 2018-04-29 DIAGNOSIS — K269 Duodenal ulcer, unspecified as acute or chronic, without hemorrhage or perforation: Secondary | ICD-10-CM | POA: Diagnosis not present

## 2018-04-29 MED ORDER — ALBUTEROL SULFATE (2.5 MG/3ML) 0.083% IN NEBU
2.5000 mg | INHALATION_SOLUTION | Freq: Once | RESPIRATORY_TRACT | Status: AC
  Administered 2018-04-29: 2.5 mg via RESPIRATORY_TRACT

## 2018-04-29 MED ORDER — UMECLIDINIUM BROMIDE 62.5 MCG/INH IN AEPB: 1.0000 | INHALATION_SPRAY | Freq: Every day | RESPIRATORY_TRACT | 0 refills | Status: DC

## 2018-04-29 MED ORDER — TRAZODONE HCL 50 MG PO TABS: 75.0000 mg | ORAL_TABLET | Freq: Every day | ORAL | 0 refills | Status: DC

## 2018-04-29 NOTE — Patient Instructions (Signed)
If bp remains below 120/80 you can go down on dose of Atacand from 32 mg to half pill daily and monitor

## 2018-04-29 NOTE — Progress Notes (Signed)
Name: Bradley Ellis   MRN: [id number]    DOB: Mar 21, 1941   Date:04/29/2018       Progress Note  Subjective  Chief Complaint  Chief Complaint  Patient presents with  . Insomnia  . Depression    HPI  HTN: he is off hctz because taking 3 medications was making his bp go too low, he is now only on Atacand and Metoprolol, off hctz, and bp is at goal. No chest pain or palpitation  Chronic bronchitis: never smoked, history of recurrent pneumonia and SOB, spirometry showed obstruction today. No cough or wheezing. CXR in 2015 showed hyperinflations. He grew up with wood stove  Afib: taking metoprolol not on blood thinner because of duodenal ulcer  Duodenal ulcer and history of pancreatitis: still has some abdominal pain ( epigastric ) when he eats, and has been eating a very bland diet, still losing weight, down 5 lbs in the past 2 weeks. Advised him to take Ensure, needs to follow up with GI. He denies blood in stools.   History of nephrectomy: low GFR but BUN was high, we will recheck labs today and consider referral to nephrologist.    Patient Active Problem List   Diagnosis Date Noted  . Diastolic dysfunction 15/40/0867  . Mild mitral regurgitation 04/17/2018  . Tricuspid regurgitation 04/17/2018  . Pancreatitis, acute 04/09/2018  . Duodenal ulcer without hemorrhage or perforation 04/09/2018  . Neuropathy 04/09/2018  . Paroxysmal A-fib ([place]) 04/03/2018  . Aneurysm of abdominal aorta (HCC) 04/03/2018  . Pain in both lower extremities 11/19/2014  . Major depression in remission ([place]) 11/18/2014  . Sleep disorder   . Hypertension   . GERD (gastroesophageal reflux disease)   . Chronic bronchitis ([place])   . Hyperlipidemia     Past Surgical History:  Procedure Laterality Date  . BREAST CYST EXCISION Bilateral    also in groin and by rectum at other times (7)  . CERVICAL DISCECTOMY  [id number]  . ELBOW ARTHROPLASTY Left 1962  . KNEE ARTHROSCOPY Left    2 arthroscopy. Another  surgery due to infection (cactus needle)  . NEPHRECTOMY Right 1998   for cancer. Done at [facility]. No other Rx  . PROSTATE SURGERY     about 15 yrs ago  . SKIN CANCER EXCISION     several in various places  . TONSILLECTOMY AND ADENOIDECTOMY  1962    Family History  Problem Relation Age of Onset  . Cancer Mother        breast cancer  . Heart disease Sister   . Stroke Sister   . Diabetes Sister   . Diabetes Maternal Grandmother     Social History   Socioeconomic History  . Marital status: Married    Spouse name: Salena Saner  . Number of children: 2  . Years of education: Not on file  . Highest education level: Some college, no degree  Occupational History  . Occupation: [employer]  . Financial resource strain: Somewhat hard  . Food insecurity:    Worry: Never true    Inability: Never true  . Transportation needs:    Medical: No    Non-medical: No  Tobacco Use  . Smoking status: Never Smoker  . Smokeless tobacco: Never Used  Substance and Sexual Activity  . Alcohol use: No  . Drug use: No  . Sexual activity: Not on file  Lifestyle  . Physical activity:    Days per week: 0 days    Minutes per  session: 0 min  . Stress: Rather much  Relationships  . Social connections:    Talks on phone: More than three times a week    Gets together: More than three times a week    Attends religious service: More than 4 times per year    Active member of club or organization: Yes    Attends meetings of clubs or organizations: More than 4 times per year    Relationship status: Married  . Intimate partner violence:    Fear of current or ex partner: No    Emotionally abused: No    Physically abused: No    Forced sexual activity: No  Other Topics Concern  . 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  aspirin EC 81 MG tablet, Take 81 mg by mouth daily., Disp: , Rfl:  .  candesartan (ATACAND) 32 MG tablet, Take 1 tablet (32 mg total) by mouth daily., Disp: 30 tablet, Rfl: 0 .  hydrochlorothiazide (HYDRODIURIL) 12.5 MG tablet, Take 1 tablet (12.5 mg total) by mouth daily., Disp: 30 tablet, Rfl: 0 .  metoprolol tartrate (LOPRESSOR) 100 MG tablet, Take 100 mg by mouth 2 (two) times daily., Disp: , Rfl:  .  pantoprazole (PROTONIX) 40 MG tablet, Take 40 mg by mouth 2 (two) times daily., Disp: , Rfl:  .  traZODone (DESYREL) 50 MG tablet, Take 1 tablet (50 mg total) by mouth at bedtime., Disp: 30 tablet, Rfl: 0 .  gabapentin (NEURONTIN) 300 MG capsule, Take 300 mg by mouth daily., Disp: , Rfl: 0  Allergies  Allergen Reactions  . Sulfa Antibiotics Rash    I personally reviewed active problem list, medication list, allergies, family history, social history with the patient/caregiver today.   ROS  Constitutional: Negative for fever, positive for  weight change.  Respiratory: Negative for cough , positive  shortness of breath    Cardiovascular: Negative for chest pain or palpitations.  Gastrointestinal: Positive  for abdominal pain, but no  bowel changes.  Musculoskeletal: Negative for gait problem or joint swelling.  Skin: Negative for rash.  Neurological: Negative for dizziness or headache.  No other specific complaints in a complete review of systems (except as listed in HPI above).  Objective  Vitals:   04/29/18 0953  BP: 120/80  Pulse: 70  Resp: 16  Temp: 98 F (36.7 C)  TempSrc: Oral  SpO2: 97%  Weight: 136 lb 3.2 oz (61.8 kg)  Height: 5\' 8"  (1.727 m)    Body mass index is 20.71 kg/m.  Physical Exam  Constitutional: Patient appears well-developed and thin , pants a loose on his waist  HEENT: head atraumatic, normocephalic, pupils equal and reactive to light,neck supple, throat within normal limits Cardiovascular: Normal rate,  regular rhythm and normal heart sounds.  No murmur heard. No BLE edema. Pulmonary/Chest: Effort normal , mild coarse crackles on left base, No respiratory distress. Abdominal: Soft.  There is mild epigastric  Tenderness, no guarding on rebound Psychiatric: Patient has a normal mood and affect. behavior is normal. Judgment and thought content normal.  Recent Results (from the past 2160 hour(s))  Lipase     Status: None   Collection Time: 04/09/18  2:55 PM  Result Value Ref Range   Lipase 29 7 - 60 U/L  COMPLETE METABOLIC PANEL WITH GFR     Status: Abnormal   Collection Time: 04/09/18  2:55 PM  Result Value Ref Range   Glucose, Bld 89 65 - 99 mg/dL    Comment: .            Fasting reference interval .    BUN 38 (H) 7 - 25 mg/dL   Creat 1.66 (H) 0.70 - 1.18 mg/dL    Comment: For patients >39 years of age, the reference limit for Creatinine is approximately 13% higher for people identified as African-American. .    GFR, Est Non African American 39 (L) > OR = 60 mL/min/1.50m2   GFR, Est African American 45 (L) > OR = 60 mL/min/1.34m2   BUN/Creatinine Ratio 23 (H) 6 - 22 (calc)   Sodium 142 135 - 146 mmol/L   Potassium 4.2 3.5 - 5.3 mmol/L   Chloride 100 98 - 110 mmol/L   CO2 30 20 - 32 mmol/L   Calcium 9.5 8.6 - 10.3 mg/dL   Total Protein 6.8 6.1 - 8.1 g/dL   Albumin 4.3 3.6 - 5.1 g/dL   Globulin 2.5 1.9 - 3.7 g/dL (calc)   AG Ratio 1.7 1.0 - 2.5 (calc)   Total Bilirubin 0.6 0.2 - 1.2 mg/dL   Alkaline phosphatase (APISO) 83 40 - 115 U/L   AST 20 10 - 35 U/L   ALT 20 9 - 46 U/L  CBC with Differential/Platelet     Status: None   Collection Time: 04/09/18  2:55 PM  Result Value Ref Range   WBC 10.3 3.8 - 10.8 Thousand/uL   RBC 5.54 4.20 - 5.80 Million/uL   Hemoglobin 16.1 13.2 - 17.1 g/dL   HCT 49.1 38.5 - 50.0 %   MCV 88.6 80.0 - 100.0 fL   MCH 29.1 27.0 - 33.0 pg   MCHC 32.8 32.0 - 36.0 g/dL   RDW 13.1 11.0 - 15.0 %   Platelets 308 140 - 400 Thousand/uL   MPV 11.7 7.5 -  12.5 fL   Neutro Abs 6,448 1,500 - 7,800 cells/uL   Lymphs Abs 2,709 850 - 3,900 cells/uL   WBC mixed population 948 200 - 950 cells/uL   Eosinophils Absolute 134 15 - 500 cells/uL   Basophils Absolute 62 0 - 200 cells/uL   Neutrophils Relative % 62.6 %   Total Lymphocyte 26.3 %   Monocytes Relative 9.2 %   Eosinophils Relative 1.3 %   Basophils Relative 0.6 %    PHQ2/9: Depression screen PHQ 2/9 04/09/2018  Decreased Interest 0  Down, Depressed, Hopeless 0  PHQ - 2 Score 0  Altered sleeping 3  Tired, decreased energy 3  Change in appetite 3  Feeling bad or failure about yourself  0  Trouble concentrating 0  Moving slowly or fidgety/restless 0  Suicidal thoughts 0  PHQ-9 Score 9  Difficult doing work/chores Not difficult at all     Fall Risk: Fall Risk  04/29/2018 04/09/2018  Falls in the past year? 0 0  Number falls in past yr: 0 0  Injury with Fall? 0 0     Assessment & Plan  1. Hypertension, benign  Stop HCTZ since bp has been dropping and he did not take dose this am, advised to not stop taking metoprolol since it is for rate control, but if bp remains low, may take half dose of Atacand  2. Simple chronic bronchitis (HCC)  Spirometry showed obstruction, we will repeat spirometry after albuterol and it  showed improvement, because of afib, we will avoid beta agonist and try Incruse instead daily   3. Chronic kidney disease, stage III (moderate) (HCC)  - BASIC METABOLIC PANEL WITH GFR  4. History of nephrectomy, right  We will recheck level   5. History of kidney cancer  No follow up since  6. Duodenal ulcer without hemorrhage or perforation  Missed call from GI we will give him contact information today   7. Abdominal aortic aneurysm (AAA) without rupture ([place])  Needs to follow up with vascular surgeon, we will give him information   8. Paroxysmal A-fib (HCC)  No palpitation   9. Primary insomnia  Sleep 4-5 hours, we will increase dose of  Trazodone - traZODone (DESYREL) 50 MG tablet; Take 1.5-2 tablets (75-100 mg total) by mouth at bedtime.  Dispense: 60 tablet; Refill: 0  10. Mild protein-calorie malnutrition ([place])  He has lost about 20 lbs in the past month, unable to eat much because of epigastric pain, add ensure and follow up with GI

## 2018-05-16 ENCOUNTER — Telehealth: Payer: Self-pay

## 2018-05-16 NOTE — Telephone Encounter (Signed)
He needs to be seen for aneurysm of aorta, very important for him to go

## 2018-05-16 NOTE — Telephone Encounter (Signed)
Copied from [place] [id number]. Topic: Referral - Question >> May 15, 2018  3:57 PM Nils Flack, Marland Kitchen wrote: Reason for CRM: per referral to vascular and vein: Spoke to pt, he does not know what referral is for and does not have insurance.  So if it is not an emergency he does not want referral. Sending note to provider. Pt would like to be contacted. Please call [phone]

## 2018-05-17 NOTE — Telephone Encounter (Signed)
Left a message on patient voicemail that Dr. Ancil Boozer advise patient it is very important for him to go to Vein and Vascular referral due to his aneurysm of his aorta.

## 2018-06-05 ENCOUNTER — Ambulatory Visit
Admission: RE | Admit: 2018-06-05 | Discharge: 2018-06-05 | Disposition: A | Discharge status: Home / Self Care | Payer: Medicare Other | Source: Ambulatory Visit | Attending: Nurse Practitioner | Admitting: Nurse Practitioner

## 2018-06-05 ENCOUNTER — Ambulatory Visit (INDEPENDENT_AMBULATORY_CARE_PROVIDER_SITE_OTHER): Payer: Medicare Other | Admitting: Nurse Practitioner

## 2018-06-05 ENCOUNTER — Encounter: Payer: Self-pay | Admitting: Nurse Practitioner

## 2018-06-05 VITALS — BP 138/76 | HR 95 | Temp 98.0°F | Resp 12 | Ht 68.0 in | Wt 136.9 lb

## 2018-06-05 DIAGNOSIS — Z599 Problem related to housing and economic circumstances, unspecified: Secondary | ICD-10-CM

## 2018-06-05 DIAGNOSIS — R0602 Shortness of breath: Secondary | ICD-10-CM | POA: Diagnosis not present

## 2018-06-05 DIAGNOSIS — I480 Paroxysmal atrial fibrillation: Secondary | ICD-10-CM

## 2018-06-05 DIAGNOSIS — R739 Hyperglycemia, unspecified: Secondary | ICD-10-CM | POA: Diagnosis not present

## 2018-06-05 DIAGNOSIS — R55 Syncope and collapse: Secondary | ICD-10-CM

## 2018-06-05 DIAGNOSIS — Z598 Other problems related to housing and economic circumstances: Secondary | ICD-10-CM | POA: Diagnosis not present

## 2018-06-05 DIAGNOSIS — R51 Headache: Secondary | ICD-10-CM | POA: Diagnosis not present

## 2018-06-05 DIAGNOSIS — R531 Weakness: Secondary | ICD-10-CM

## 2018-06-05 DIAGNOSIS — I071 Rheumatic tricuspid insufficiency: Secondary | ICD-10-CM | POA: Diagnosis not present

## 2018-06-05 DIAGNOSIS — I340 Nonrheumatic mitral (valve) insufficiency: Secondary | ICD-10-CM

## 2018-06-05 DIAGNOSIS — G44319 Acute post-traumatic headache, not intractable: Secondary | ICD-10-CM | POA: Insufficient documentation

## 2018-06-05 DIAGNOSIS — R42 Dizziness and giddiness: Secondary | ICD-10-CM | POA: Diagnosis not present

## 2018-06-05 DIAGNOSIS — R634 Abnormal weight loss: Secondary | ICD-10-CM

## 2018-06-05 NOTE — Patient Instructions (Addendum)
-Please go across the street to the imaging center to get a chest x-ray; Please keep your phone on you and off silent so you can receive a phone call from [place] about the results of your imaging tests today.  -Your CT Scan is scheduled    -If you have another episode of dizziness and passing out please call 911 and get emergency help right away. -Please ensure that you are drinking plenty of water and avoid caffeine.  Please slowly change positions from laying to sitting wait a few minutes and then go from sitting to standing, especially if you are getting up in the middle of the night.  - You have been referred to a cardiologist, heart doctor, you should receive a phone call within the next few weeks for scheduling. If you do not please call us to let us know.  - You should receive a phone call or mychart message about your results from your labs drawn today in the next week. Please call us if you     Syncope  Syncope refers to a condition in which a person temporarily loses consciousness. Syncope may also be called fainting or passing out. It is caused by a sudden decrease in blood flow to the brain. Even though most causes of syncope are not dangerous, syncope can be a sign of a serious medical problem. Your health care provider may do tests to find the reason why you are having syncope. Signs that you may be about to faint include:  Feeling dizzy or light-headed.  Feeling nauseous.  Seeing all white or all black in your field of vision.  Having cold, clammy skin. If you faint, get medical help right away. Call your local emergency services (911 in the [place] Do not drive yourself to the hospital. Follow these instructions at home: Pay attention to any changes in your symptoms. Take these actions to stay safe and to help relieve your symptoms: Lifestyle  Do not drive, use machinery, or play sports until your health care provider says it is okay.  Do not drink alcohol.  Do not use any  products that contain nicotine or tobacco, such as cigarettes and e-cigarettes. If you need help quitting, ask your health care provider.  Drink enough fluid to keep your urine pale yellow. General instructions  Take over-the-counter and prescription medicines only as told by your health care provider.  If you are taking blood pressure or heart medicine, get up slowly and take several minutes to sit and then stand. This can reduce dizziness or light-headedness.  Have someone stay with you until you feel stable.  If you start to feel like you might faint, lie down right away and raise (elevate) your feet above the level of your heart. Breathe deeply and steadily. Wait until all the symptoms have passed.  Keep all follow-up visits as told by your health care provider. This is important. Get help right away if you:  Have a severe headache.  Faint once or repeatedly.  Have pain in your chest, abdomen, or back.  Have a very fast or irregular heartbeat (palpitations).  Have pain when you breathe.  Are bleeding from your mouth or rectum, or you have black or tarry stool.  Have a seizure.  Are confused.  Have trouble walking.  Have severe weakness.  Have vision problems. These symptoms may represent a serious problem that is an emergency. Do not wait to see if your symptoms will go away. Get medical help right away.  Call your local emergency services (911 in the [place] Do not drive yourself to the hospital. Summary  Syncope refers to a condition in which a person temporarily loses consciousness. It is caused by a sudden decrease in blood flow to the brain.  Signs that you may be about to faint include dizziness, feeling light-headed, feeling nauseous, sudden vision changes, or cold, clammy skin.  Although most causes of syncope are not dangerous, syncope can be a sign of a serious medical problem. If you faint, get medical help right away. This information is not intended to  replace advice given to you by your health care provider. Make sure you discuss any questions you have with your health care provider. Document Released: 04/24/2005 Document Revised: 04/02/2017 Document Reviewed: 04/02/2017 Elsevier Interactive Patient Education  2019 [employer].

## 2018-06-05 NOTE — Progress Notes (Signed)
Name: Bradley Ellis   MRN: [id number]    DOB: May 15, 1940   Date:06/05/2018       Progress Note  Subjective  Chief Complaint  Chief Complaint  Patient presents with  . Hyperglycemia    checked today 196 after eating pt states had headache and was dizzy  . Dizziness    pt states passed out and hit head on floor    HPI  Patient had syncopal episode on Saturday 1 am when he had woken up from sleep to use the bathroom. States got up go to the bathroom, while walking felt the room spinning and passed out for a few seconds and woke up when he hit the floor. Endorses posterior head soreness.  Patient had similar episode January 7th, and daughter reports had a previous episodes of December reported by wife. No reported injuries with this fall. Did not see anyone for any of theses syncopal episodes. States he stopped taking aspirin 46m 2 weeks ago; had started it in December 2019.  Endorses dull frontal headache partial resolution with acetaminophen. Endorses generalized weakness, decided to check blood sugar and noted that it was 197- Pedialyte, cheerios, and banana.  Has aortic aneurysm and was referred to vascular but did not go because he did not understand why; states he would just get routine ultrasounds previously. Was also referred to GI due to pancreatitis and duodenal ulcer has appointment on 2/7. He has chronic tinnitus.  States the last 2-3 months has noticed weight loss despite increase in appetite. Patient notes shortness of breath over the past 2-3 months states both with exertion and at rest states sometimes he cant talk for a long time because he gets winded. Started on Incruse at last visit- did not pick it up because it was too expensive. No chest tightness, chest pain, leg pain, cough. Endorses mild nasal congestion since moving to [place].  Wt Readings from Last 3 Encounters:  06/05/18 136 lb 14.4 oz (62.1 kg)  04/29/18 136 lb 3.2 oz (61.8 kg)  04/09/18 142 lb 9.6 oz (64.7 kg)      Patient Active Problem List   Diagnosis Date Noted  . Primary insomnia 04/29/2018  . History of kidney cancer 04/29/2018  . 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 Chronic kidney disease, stage III (moderate) ([facility] 04/29/2018  . Diastolic dysfunction 180/99/8338 . Mild mitral regurgitation 04/17/2018  . Tricuspid regurgitation 04/17/2018  . Pancreatitis, acute 04/09/2018  . Duodenal ulcer without hemorrhage or perforation 04/09/2018  . Neuropathy 04/09/2018  . Paroxysmal A-fib ([facility] 04/03/2018  . Aneurysm of abdominal aorta (HCC) 04/03/2018  . Pain in both lower extremities 11/19/2014  . Major depression in remission ([facility] 11/18/2014  . Sleep disorder   . Hypertension   . GERD (gastroesophageal reflux disease)   . Chronic bronchitis ([facility]   . Hyperlipidemia     Past Medical History:  Diagnosis Date  . Chronic bronchitis (HCC)    not from smoking  . 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 GERD (gastroesophageal reflux disease)   . Hyperlipidemia   . Hypertension   . Laryngeal nodule    benign  . Sleep disorder     Past Surgical History:  Procedure Laterality Date  . BREAST CYST EXCISION Bilateral    also in groin and by rectum at other times (7)  . CERVICAL DISCECTOMY  [id number] . ELBOW ARTHROPLASTY Left 1962  . KNEE ARTHROSCOPY Left    2 arthroscopy. Another surgery due to  infection (cactus needle)  . NEPHRECTOMY Right 1998   for cancer. Done at [facility]. No other Rx  . PROSTATE SURGERY     about 15 yrs ago  . SKIN CANCER EXCISION     several in various places  . TONSILLECTOMY AND ADENOIDECTOMY  1962    Social History   Tobacco Use  . Smoking status: Never Smoker  . Smokeless tobacco: Never Used  Substance Use Topics  . Alcohol use: No     Current Outpatient Medications:  .  candesartan (ATACAND) 32 MG tablet, Take 1 tablet (32 mg total) by mouth daily., Disp: 30 tablet, Rfl: 0 .  gabapentin (NEURONTIN) 300 MG capsule, Take  300 mg by mouth daily., Disp: , Rfl: 0 .  traZODone (DESYREL) 50 MG tablet, Take 1.5-2 tablets (75-100 mg total) by mouth at bedtime., Disp: 60 tablet, Rfl: 0 .  aspirin EC 81 MG tablet, Take 81 mg by mouth daily., Disp: , Rfl:  .  metoprolol tartrate (LOPRESSOR) 100 MG tablet, Take 100 mg by mouth 2 (two) times daily., Disp: , Rfl:  .  pantoprazole (PROTONIX) 40 MG tablet, Take 40 mg by mouth 2 (two) times daily., Disp: , Rfl:  .  umeclidinium bromide (INCRUSE ELLIPTA) 62.5 MCG/INH AEPB, Inhale 1 puff into the lungs daily. (Patient not taking: Reported on 06/05/2018), Disp: 30 each, Rfl: 0  Allergies  Allergen Reactions  . Sulfa Antibiotics Rash    Review of Systems  Constitutional: Positive for malaise/fatigue and weight loss. Negative for chills, diaphoresis and fever.  HENT: Positive for congestion and tinnitus (chronic). Negative for ear discharge, ear pain, sinus pain and sore throat.   Eyes: Negative for blurred vision, photophobia and pain.  Respiratory: Positive for shortness of breath. Negative for cough.   Cardiovascular: Negative for chest pain, palpitations, orthopnea and leg swelling.  Gastrointestinal: Positive for diarrhea (chronic). Negative for abdominal pain, blood in stool, constipation, heartburn, nausea and vomiting.  Genitourinary: Negative for dysuria, flank pain and hematuria.  Musculoskeletal: Positive for falls. Negative for back pain and myalgias.  Skin: Negative for itching and rash.  Neurological: Positive for dizziness, weakness and headaches. Negative for tingling, tremors, sensory change, speech change, focal weakness and seizures.  Endo/Heme/Allergies: Bruises/bleeds easily (when on the aspirin, resolved when he stopped).  Psychiatric/Behavioral: Negative for depression. The patient is not nervous/anxious and does not have insomnia.     No other specific complaints in a complete review of systems (except as listed in HPI above).  Objective  Vitals:    06/05/18 1419  BP: 138/76  Pulse: 95  Resp: 12  Temp: 98 F (36.7 C)  TempSrc: Oral  SpO2: 96%  Weight: 136 lb 14.4 oz (62.1 kg)  Height: _0  (1.727 m)    Body mass index is 20.82 kg/m.  Nursing Note and Vital Signs reviewed.  Physical Exam Constitutional:      Appearance: Normal appearance. He is well-developed, well-groomed and normal weight.  HENT:     Head: Normocephalic and atraumatic. No abrasion, contusion or laceration.      Comments: No temporal tenderness     Right Ear: Hearing, tympanic membrane, ear canal and external ear normal.     Left Ear: Hearing, tympanic membrane, ear canal and external ear normal.     Nose: Congestion present. No septal deviation or mucosal edema.     Right Sinus: No maxillary sinus tenderness or frontal sinus tenderness.     Left Sinus: No maxillary sinus tenderness or frontal  sinus tenderness.     Mouth/Throat:     Lips: Pink.     Mouth: Mucous membranes are moist.     Pharynx: Oropharynx is clear. Uvula midline. No uvula swelling.  Eyes:     General: No visual field deficit.    Extraocular Movements: Extraocular movements intact.     Conjunctiva/sclera: Conjunctivae normal.     Pupils: Pupils are equal, round, and reactive to light.  Neck:     Musculoskeletal: Neck supple. Normal range of motion. No neck rigidity.     Thyroid: No thyroid mass or thyromegaly.     Vascular: No carotid bruit or JVD.     Trachea: Trachea normal.  Cardiovascular:     Rate and Rhythm: Normal rate.     Pulses:          Carotid pulses are 3+ on the right side and 3+ on the left side.      Radial pulses are 3+ on the right side and 3+ on the left side.     Heart sounds: Normal heart sounds. No murmur (unable to appreciate murmur).     Comments: Lower extremities cool  Pulmonary:     Effort: Pulmonary effort is normal.     Breath sounds: No stridor. No wheezing, rhonchi or rales.  Chest:     Chest wall: No mass or tenderness.  Abdominal:      General: Bowel sounds are normal.     Palpations: Abdomen is soft.     Tenderness: There is no abdominal tenderness. There is no right CVA tenderness or left CVA tenderness.     Comments: No abdominal bruit auscultated  Musculoskeletal: Normal range of motion.     Right lower leg: No edema.     Left lower leg: No edema.  Lymphadenopathy:     Cervical: No cervical adenopathy.  Skin:    General: Skin is warm and dry.     Capillary Refill: Capillary refill takes less than 2 seconds.     Findings: No abrasion, bruising or rash.     Nails: There is no clubbing.   Neurological:     Mental Status: He is alert.     GCS: GCS eye subscore is 4. GCS verbal subscore is 5. GCS motor subscore is 6.     Cranial Nerves: No cranial nerve deficit, dysarthria or facial asymmetry.     Sensory: No sensory deficit.     Motor: Weakness (generalized weakness) present. No tremor.     Coordination: Coordination is intact.     Gait: Gait is intact.  Psychiatric:        Mood and Affect: Mood normal.        Speech: Speech normal.        Behavior: Behavior normal. Behavior is cooperative.        Thought Content: Thought content normal.        Judgment: Judgment normal.    Orthostatic VS for the past 24 hrs:  BP- Lying Pulse- Lying BP- Standing at 0 minutes Pulse- Standing at 0 minutes  06/05/18 1508 120/78 86 124/82 91      No results found for this or any previous visit (from the past 48 hour(s)).  Assessment & Plan  1. Syncope, unspecified syncope type - EKG 12-Lead: Sinus Rhythm with RBBB. Rate 80 bpm.  - Orthostatic vital signs: Negative - CBC w/Diff/Platelet - COMPLETE METABOLIC PANEL WITH GFR - TSH - CT Head Wo Contrast; Future - Ambulatory referral to Cardiology  2. Acute post-traumatic headache, not intractable - CT Head Wo Contrast; Future  3. Dizziness - TSH - CT Head Wo Contrast; Future - Ambulatory referral to Cardiology  4. Weakness - CBC w/Diff/Platelet - COMPLETE  METABOLIC PANEL WITH GFR - TSH - CT Head Wo Contrast; Future  5. Hyperglycemia Will check due to patient and family's concern of elevated blood sugar reading. Recent normal fasting reading in blood work.  - HgB A1c  6. Shortness of breath Working on connected care referral to help assist with inhaler  - DG Chest 2 View; Future - Ambulatory referral to Cardiology  7. Unintentional weight loss - COMPLETE METABOLIC PANEL WITH GFR - TSH - Sed Rate (ESR) - C-reactive protein  8. Paroxysmal A-fib ([place]) - Ambulatory referral to Cardiology  9. Mild mitral regurgitation - Ambulatory referral to Cardiology  10. Tricuspid valve insufficiency, unspecified etiology - Ambulatory referral to Cardiology  11. Financial difficulties - Ambulatory referral to Connected Care  Complex patient presents with multiple complaints including dizziness, syncope, weakness, shortness of breath, falls, weight loss, fatigue. He has had 2 possibly 3 episodes of dizziness and and syncopal episode.  Now endorses mild posterior and frontal headache, no other neuro deficits.  Patient is a poor historian, daughter does not note change in memory.  Daughter noted first episode in December however, patient states he could not recall this episode.  He has a history of an abdominal aneurysm, valvular disorder, proximal A. Fib.  Stat CT ordered, EKG does not show any acute findings of concern.  Will refer to cardiology for further evaluation.  Patient is agreeable and states he will go to this appointment. Discussed if he has another syncopal episode to get emergency care and not to wait for primary care appointment.  Discussed the risks and benefits of antiplatelet therapy, will defer to cardiology whether to restart aspirin and for further management of cardiac risk.  Orthostatic vitals were negative.  Discussed importance of changing positions slowly especially at nighttime.  Discussed importance of hydration, especially since  patient has 1 functioning kidney.  He has not followed up with vascular as he was referred by his PCP.  Family is most concerned about hyperglycemia, offered reassurance and will check A1c. Face-to-face time with patient was more than 40 minutes, >50% time spent counseling and coordination of care

## 2018-06-06 ENCOUNTER — Ambulatory Visit: Payer: Self-pay

## 2018-06-06 ENCOUNTER — Ambulatory Visit (INDEPENDENT_AMBULATORY_CARE_PROVIDER_SITE_OTHER): Payer: Medicare Other | Admitting: Family Medicine

## 2018-06-06 ENCOUNTER — Encounter: Payer: Self-pay | Admitting: Family Medicine

## 2018-06-06 VITALS — BP 126/74 | HR 100 | Temp 98.0°F | Resp 16 | Ht 68.0 in | Wt 134.0 lb

## 2018-06-06 DIAGNOSIS — E441 Mild protein-calorie malnutrition: Secondary | ICD-10-CM | POA: Insufficient documentation

## 2018-06-06 DIAGNOSIS — F325 Major depressive disorder, single episode, in full remission: Secondary | ICD-10-CM

## 2018-06-06 DIAGNOSIS — I714 Abdominal aortic aneurysm, without rupture, unspecified: Secondary | ICD-10-CM

## 2018-06-06 DIAGNOSIS — J410 Simple chronic bronchitis: Secondary | ICD-10-CM

## 2018-06-06 DIAGNOSIS — I10 Essential (primary) hypertension: Secondary | ICD-10-CM

## 2018-06-06 DIAGNOSIS — I480 Paroxysmal atrial fibrillation: Secondary | ICD-10-CM

## 2018-06-06 DIAGNOSIS — F5101 Primary insomnia: Secondary | ICD-10-CM | POA: Diagnosis not present

## 2018-06-06 DIAGNOSIS — R9389 Abnormal findings on diagnostic imaging of other specified body structures: Secondary | ICD-10-CM

## 2018-06-06 DIAGNOSIS — R634 Abnormal weight loss: Secondary | ICD-10-CM

## 2018-06-06 LAB — CBC WITH DIFFERENTIAL/PLATELET
ABSOLUTE MONOCYTES: 599 {cells}/uL (ref 200–950)
BASOS PCT: 0.1 %
Basophils Absolute: 7 cells/uL (ref 0–200)
Eosinophils Absolute: 104 cells/uL (ref 15–500)
Eosinophils Relative: 1.4 %
HCT: 47.9 % (ref 38.5–50.0)
HEMOGLOBIN: 15.6 g/dL (ref 13.2–17.1)
Lymphs Abs: 2250 cells/uL (ref 850–3900)
MCH: 28.7 pg (ref 27.0–33.0)
MCHC: 32.6 g/dL (ref 32.0–36.0)
MCV: 88.1 fL (ref 80.0–100.0)
MPV: 10.6 fL (ref 7.5–12.5)
Monocytes Relative: 8.1 %
Neutro Abs: 4440 cells/uL (ref 1500–7800)
Neutrophils Relative %: 60 %
Platelets: 246 10*3/uL (ref 140–400)
RBC: 5.44 10*6/uL (ref 4.20–5.80)
RDW: 13.4 % (ref 11.0–15.0)
Total Lymphocyte: 30.4 %
WBC: 7.4 10*3/uL (ref 3.8–10.8)

## 2018-06-06 LAB — COMPLETE METABOLIC PANEL WITH GFR
AG RATIO: 1.7 (calc) (ref 1.0–2.5)
ALT: 15 U/L (ref 9–46)
AST: 14 U/L (ref 10–35)
Albumin: 4.2 g/dL (ref 3.6–5.1)
Alkaline phosphatase (APISO): 74 U/L (ref 40–115)
BUN/Creatinine Ratio: 15 (calc) (ref 6–22)
BUN: 20 mg/dL (ref 7–25)
CALCIUM: 9.7 mg/dL (ref 8.6–10.3)
CO2: 30 mmol/L (ref 20–32)
Chloride: 101 mmol/L (ref 98–110)
Creat: 1.36 mg/dL — ABNORMAL HIGH (ref 0.70–1.18)
GFR, Est African American: 57 mL/min/{1.73_m2} — ABNORMAL LOW (ref >=60)
GFR, Est Non African American: 49 mL/min/{1.73_m2} — ABNORMAL LOW (ref >=60)
Globulin: 2.5 g/dL (calc) (ref 1.9–3.7)
Glucose, Bld: 84 mg/dL (ref 65–99)
Potassium: 4.7 mmol/L (ref 3.5–5.3)
Sodium: 142 mmol/L (ref 135–146)
Total Bilirubin: 0.7 mg/dL (ref 0.2–1.2)
Total Protein: 6.7 g/dL (ref 6.1–8.1)

## 2018-06-06 LAB — HEMOGLOBIN A1C
Hgb A1c MFr Bld: 5.7 % of total Hgb — ABNORMAL HIGH (ref <5.7)
Mean Plasma Glucose: 117 (calc)
eAG (mmol/L): 6.5 (calc)

## 2018-06-06 LAB — TSH: TSH: 3.63 mIU/L (ref 0.40–4.50)

## 2018-06-06 LAB — SEDIMENTATION RATE: Sed Rate: 6 mm/h (ref 0–20)

## 2018-06-06 LAB — C-REACTIVE PROTEIN: CRP: 6.7 mg/L (ref <8.0)

## 2018-06-06 MED ORDER — PANTOPRAZOLE SODIUM 40 MG PO TBEC: 40.0000 mg | DELAYED_RELEASE_TABLET | Freq: Every day | ORAL | 0 refills | Status: DC

## 2018-06-06 MED ORDER — HYDRALAZINE HCL 10 MG PO TABS: 10.0000 mg | ORAL_TABLET | Freq: Three times a day (TID) | ORAL | 0 refills | Status: DC | PRN

## 2018-06-06 MED ORDER — TRAZODONE HCL 50 MG PO TABS: 50.0000 mg | ORAL_TABLET | Freq: Every day | ORAL | 0 refills | Status: DC

## 2018-06-06 NOTE — Telephone Encounter (Signed)
Normal heart size. New right-sided anterior mediastinal mass. Unchanged chronic pleural thickening at the right costophrenic angle. No focal consolidation, pleural effusion, or pneumothorax. No acute osseous abnormality.  IMPRESSION: 1. New right-sided anterior mediastinal mass. Recommend CT chest with contrast for further evaluation.  These results will be called to the ordering clinician or representative by the Radiologist Assistant, and communication documented in the PACS or zVision Dashboard.  Notified off of results.  Routed

## 2018-06-06 NOTE — Progress Notes (Signed)
Name: Bradley Ellis   MRN: [id number]    DOB: 12-Apr-1941   Date:06/06/2018       Progress Note  Subjective  Chief Complaint  Chief Complaint  Patient presents with  . Results    CT scan and chest x-ray    HPI  Abnormal CXR he was seen yesterday by Suezanne Cheshire, for SOB, weight loss and hoarseness, CXR and labs were done, CXR showed possible mediastinal mass. Patient denies cough, but has SOB, he continues to lose weight even though he eats. Still has epigastric pain and needs refill of pantoprazole He brought his medication but he has stopped taking all bp medications daily because he noticed bp has been dropping and he gets dizzy. He has fallen 3 times during the night when he gets up to void. He also stopped taking aspirin because of bruising. He has a history of afib and appointment has been made to see cardiologist.   Reviewed labs and CXR with him and his daughter during office visit   Patient Active Problem List   Diagnosis Date Noted  . Primary insomnia 04/29/2018  . History of kidney cancer 04/29/2018  . 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 Chronic kidney disease, stage III (moderate) ([place]) 04/29/2018  . Diastolic dysfunction 83/66/2947  . Mild mitral regurgitation 04/17/2018  . Tricuspid regurgitation 04/17/2018  . Pancreatitis, acute 04/09/2018  . Duodenal ulcer without hemorrhage or perforation 04/09/2018  . Neuropathy 04/09/2018  . Paroxysmal A-fib ([place]) 04/03/2018  . Aneurysm of abdominal aorta (HCC) 04/03/2018  . Pain in both lower extremities 11/19/2014  . Major depression in remission ([place]) 11/18/2014  . Sleep disorder   . Hypertension   . GERD (gastroesophageal reflux disease)   . Chronic bronchitis ([place])   . Hyperlipidemia     Past Surgical History:  Procedure Laterality Date  . BREAST CYST EXCISION Bilateral    also in groin and by rectum at other times (7)  . CERVICAL DISCECTOMY  [id number]  . ELBOW ARTHROPLASTY Left 1962  . KNEE  ARTHROSCOPY Left    2 arthroscopy. Another surgery due to infection (cactus needle)  . NEPHRECTOMY Right 1998   for cancer. Done at [facility]. No other Rx  . PROSTATE SURGERY     about 15 yrs ago  . SKIN CANCER EXCISION     several in various places  . TONSILLECTOMY AND ADENOIDECTOMY  1962    Family History  Problem Relation Age of Onset  . Cancer Mother        breast cancer  . Heart disease Sister   . Stroke Sister   . Diabetes Sister   . Diabetes Maternal Grandmother     Social History   Socioeconomic History  . Marital status: Married    Spouse name: Salena Saner  . Number of children: 2  . Years of education: Not on file  . Highest education level: Some college, no degree  Occupational History  . Occupation: [employer]  . Financial resource strain: Somewhat hard  . Food insecurity:    Worry: Never true    Inability: Never true  . Transportation needs:    Medical: No    Non-medical: No  Tobacco Use  . Smoking status: Never Smoker  . Smokeless tobacco: Never Used  Substance and Sexual Activity  . Alcohol use: No  . Drug use: No  . Sexual activity: Not on file  Lifestyle  . Physical activity:    Days per week: 0 days  Minutes per session: 0 min  . Stress: Rather much  Relationships  . Social connections:    Talks on phone: More than three times a week    Gets together: More than three times a week    Attends religious service: More than 4 times per year    Active member of club or organization: Yes    Attends meetings of clubs or organizations: More than 4 times per year    Relationship status: Married  . Intimate partner violence:    Fear of current or ex partner: No    Emotionally abused: No    Physically abused: No    Forced sexual activity: No  Other Topics Concern  . 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  pantoprazole (PROTONIX) 40 MG tablet, Take 1 tablet (40 mg total) by mouth daily., Disp: 90 tablet, Rfl: 0 .  traZODone (DESYREL) 50 MG tablet, Take 1 tablet (50 mg total) by mouth at bedtime., Disp: 90 tablet, Rfl: 0 .  aspirin EC 81 MG tablet, Take 81 mg by mouth daily., Disp: , Rfl:  .  hydrALAZINE (APRESOLINE) 10 MG tablet, Take 1 tablet (10 mg total) by mouth 3 (three) times daily as needed. bp above 150/90, Disp: 90 tablet, Rfl: 0  Allergies  Allergen Reactions  . Sulfa Antibiotics Rash    I personally reviewed active problem list, medication list, allergies, family history, social history with the patient/caregiver today.   ROS  Constitutional: Negative for fever , positive for  weight change.  Respiratory: he denies  cough but has  shortness of breath.   Cardiovascular: Negative for chest pain or palpitations.  Gastrointestinal: Negative for abdominal pain, no bowel changes.  Musculoskeletal: positive for gait problem - feeling weak but no  joint swelling.  Skin: Negative for rash.  Neurological:positive for dizziness but no  headache.  No other specific complaints in a complete review of systems (except as listed in HPI above). Objective  Vitals:   06/06/18 1137  BP: 126/74  Pulse: 100  Resp: 16  Temp: 98 F (36.7 C)  TempSrc: Oral  SpO2: 97%  Weight: 134 lb (60.8 kg)  Height: 5' 8"  (1.727 m)    Body mass index is 20.37 kg/m.  Physical Exam  Constitutional: Patient appears very thin. No distress.  HEENT: head atraumatic, normocephalic, pupils equal and reactive to light,  neck supple, throat within normal limits Cardiovascular: Normal rate, regular rhythm and normal heart sounds.  No murmur heard. No BLE edema. Pulmonary/Chest: Effort normal and breath sounds normal. No respiratory distress. Abdominal: Soft.  There is epigastric  tenderness. Psychiatric: Patient has a normal mood  and affect. behavior is normal. Judgment and thought content normal.  Recent Results (from the past 2160 hour(s))  Lipase     Status: None   Collection Time: 04/09/18  2:55 PM  Result Value Ref Range   Lipase 29 7 - 60 U/L  COMPLETE METABOLIC PANEL WITH GFR     Status: Abnormal   Collection Time: 04/09/18  2:55 PM  Result Value Ref Range   Glucose, Bld 89 65 - 99 mg/dL    Comment: .            Fasting reference interval .    BUN 38 (  H) 7 - 25 mg/dL   Creat 1.66 (H) 0.70 - 1.18 mg/dL    Comment: For patients >68 years of age, the reference limit for Creatinine is approximately 13% higher for people identified as African-American. .    GFR, Est Non African American 39 (L) > OR = 60 mL/min/1.95m   GFR, Est African American 45 (L) > OR = 60 mL/min/1.741m  BUN/Creatinine Ratio 23 (H) 6 - 22 (calc)   Sodium 142 135 - 146 mmol/L   Potassium 4.2 3.5 - 5.3 mmol/L   Chloride 100 98 - 110 mmol/L   CO2 30 20 - 32 mmol/L   Calcium 9.5 8.6 - 10.3 mg/dL   Total Protein 6.8 6.1 - 8.1 g/dL   Albumin 4.3 3.6 - 5.1 g/dL   Globulin 2.5 1.9 - 3.7 g/dL (calc)   AG Ratio 1.7 1.0 - 2.5 (calc)   Total Bilirubin 0.6 0.2 - 1.2 mg/dL   Alkaline phosphatase (APISO) 83 40 - 115 U/L   AST 20 10 - 35 U/L   ALT 20 9 - 46 U/L  CBC with Differential/Platelet     Status: None   Collection Time: 04/09/18  2:55 PM  Result Value Ref Range   WBC 10.3 3.8 - 10.8 Thousand/uL   RBC 5.54 4.20 - 5.80 Million/uL   Hemoglobin 16.1 13.2 - 17.1 g/dL   HCT 49.1 38.5 - 50.0 %   MCV 88.6 80.0 - 100.0 fL   MCH 29.1 27.0 - 33.0 pg   MCHC 32.8 32.0 - 36.0 g/dL   RDW 13.1 11.0 - 15.0 %   Platelets 308 140 - 400 Thousand/uL   MPV 11.7 7.5 - 12.5 fL   Neutro Abs 6,448 1,500 - 7,800 cells/uL   Lymphs Abs 2,709 850 - 3,900 cells/uL   WBC mixed population 948 200 - 950 cells/uL   Eosinophils Absolute 134 15 - 500 cells/uL   Basophils Absolute 62 0 - 200 cells/uL   Neutrophils Relative % 62.6 %   Total Lymphocyte 26.3  %   Monocytes Relative 9.2 %   Eosinophils Relative 1.3 %   Basophils Relative 0.6 %  CBC w/Diff/Platelet     Status: None   Collection Time: 06/05/18  3:42 PM  Result Value Ref Range   WBC 7.4 3.8 - 10.8 Thousand/uL   RBC 5.44 4.20 - 5.80 Million/uL   Hemoglobin 15.6 13.2 - 17.1 g/dL   HCT 47.9 38.5 - 50.0 %   MCV 88.1 80.0 - 100.0 fL   MCH 28.7 27.0 - 33.0 pg   MCHC 32.6 32.0 - 36.0 g/dL   RDW 13.4 11.0 - 15.0 %   Platelets 246 140 - 400 Thousand/uL   MPV 10.6 7.5 - 12.5 fL   Neutro Abs 4,440 1,500 - 7,800 cells/uL   Lymphs Abs 2,250 850 - 3,900 cells/uL   Absolute Monocytes 599 200 - 950 cells/uL   Eosinophils Absolute 104 15 - 500 cells/uL   Basophils Absolute 7 0 - 200 cells/uL   Neutrophils Relative % 60 %   Total Lymphocyte 30.4 %   Monocytes Relative 8.1 %   Eosinophils Relative 1.4 %   Basophils Relative 0.1 %  COMPLETE METABOLIC PANEL WITH GFR     Status: Abnormal   Collection Time: 06/05/18  3:42 PM  Result Value Ref Range   Glucose, Bld 84 65 - 99 mg/dL    Comment: .            Fasting reference interval .  BUN 20 7 - 25 mg/dL   Creat 1.36 (H) 0.70 - 1.18 mg/dL    Comment: For patients >35 years of age, the reference limit for Creatinine is approximately 13% higher for people identified as African-American. .    GFR, Est Non African American 49 (L) > OR = 60 mL/min/1.4m   GFR, Est African American 57 (L) > OR = 60 mL/min/1.770m  BUN/Creatinine Ratio 15 6 - 22 (calc)   Sodium 142 135 - 146 mmol/L   Potassium 4.7 3.5 - 5.3 mmol/L   Chloride 101 98 - 110 mmol/L   CO2 30 20 - 32 mmol/L   Calcium 9.7 8.6 - 10.3 mg/dL   Total Protein 6.7 6.1 - 8.1 g/dL   Albumin 4.2 3.6 - 5.1 g/dL   Globulin 2.5 1.9 - 3.7 g/dL (calc)   AG Ratio 1.7 1.0 - 2.5 (calc)   Total Bilirubin 0.7 0.2 - 1.2 mg/dL   Alkaline phosphatase (APISO) 74 40 - 115 U/L   AST 14 10 - 35 U/L   ALT 15 9 - 46 U/L  TSH     Status: None   Collection Time: 06/05/18  3:42 PM  Result Value  Ref Range   TSH 3.63 0.40 - 4.50 mIU/L  Sed Rate (ESR)     Status: None   Collection Time: 06/05/18  3:42 PM  Result Value Ref Range   Sed Rate 6 0 - 20 mm/h  C-reactive protein     Status: None   Collection Time: 06/05/18  3:42 PM  Result Value Ref Range   CRP 6.7 <8.0 mg/L  HgB A1c     Status: Abnormal   Collection Time: 06/05/18  3:42 PM  Result Value Ref Range   Hgb A1c MFr Bld 5.7 (H) <5.7 % of total Hgb    Comment: For someone without known diabetes, a hemoglobin  A1c value between 5.7% and 6.4% is consistent with prediabetes and should be confirmed with a  follow-up test. . For someone with known diabetes, a value <7% indicates that their diabetes is well controlled. A1c targets should be individualized based on duration of diabetes, age, comorbid conditions, and other considerations. . This assay result is consistent with an increased risk of diabetes. . Currently, no consensus exists regarding use of hemoglobin A1c for diagnosis of diabetes for children. .    Mean Plasma Glucose 117 (calc)   eAG (mmol/L) 6.5 (calc)     PHQ2/9: [facility] 04/09/2018  Decreased Interest 0  Down, Depressed, Hopeless 0  PHQ - 2 Score 0  Altered sleeping 3  Tired, decreased energy 3  Change in appetite 3  Feeling bad or failure about yourself  0  Trouble concentrating 0  Moving slowly or fidgety/restless 0  Suicidal thoughts 0  PHQ-9 Score 9  Difficult doing work/chores Not difficult at all     Fall Risk: Fall Risk  04/29/2018 04/09/2018  Falls in the past year? 0 0  Number falls in past yr: 0 0  Injury with Fall? 0 0     Assessment & Plan  1. Abnormal chest x-ray  - CT Chest Wo Contrast; Future  2. Hypertension, benign  - hydrALAZINE (APRESOLINE) 10 MG tablet; Take 1 tablet (10 mg total) by mouth 3 (three) times daily as needed. bp above 150/90  Dispense: 90 tablet; Refill: 0  3. Primary insomnia  - traZODone (DESYREL) 50 MG tablet; Take 1  tablet (50 mg total) by mouth at bedtime.  Dispense: 90 tablet; Refill: 0  4. Paroxysmal A-fib [facility])  He will follow up with cardiologist, he has not been taking lopressor or aspirin, he stopped on his own   5. 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    5. Unintentional weight loss   6. Mild protein-calorie malnutrition ([place])  He ran out of Ensure   7. Simple chronic bronchitis ([place])  Could not afford inhaler, states no cough but has SOB   8. Major depression in remission (HCC)  Stable   9. Abdominal aortic aneurysm (AAA) without rupture [facility])  He did not go see vascular surgeon

## 2018-06-07 ENCOUNTER — Ambulatory Visit: Payer: Self-pay

## 2018-06-07 NOTE — Telephone Encounter (Signed)
Phone call from daughter.  Reported pt. had episode of dizziness and feeling shaky, when moving from sitting to standing position, this AM.  Reported blood sugar 145 at 10:00 AM, about 15 minutes after he finished breakfast.  Did not check blood sugar fasting.  Drank glass of Ensure, and ate bread, eggs with cheese, and beans.  Daughter voiced concern that his blood sugar was high.  Reassured that since he has eaten, his blood sugar would be elevated, and that 145 post prandial is not concerning.  Reported he has had weakness for several days.  Reported the dizziness passed after laying down.  BP 117/69, pulse 93 while laying.  BP 120/88, pulse 103 after standing.  Stated has chronic diarrhea; has had 3 diarrhea stools in past 24 hrs. Advised importance of staying hydrated.  Reviewed signs of dehydration.  Encouraged to change position gradually from laying to sitting or from sitting to standing.  Pt. And daughter verb. Understanding of instructions.  Advised will send triage note to PCP, at this time, for her knowledge of pt's complaints.  Agreed with plan.         Reason for Disposition . [1] MILD dizziness (e.g., walking normally) AND [2] has NOT been evaluated by physician for this  (Exception: dizziness caused by heat exposure, sudden standing, or poor fluid intake)    Was seen in office yesterday.  Reported episode of dizziness and felt shaky this morning.  BP 117/69, pulse 93 laying; BP 120/88, pulse 103 standing. Blood sugar 145 after eating breakfast.  Answer Assessment - Initial Assessment Questions 1. DESCRIPTION: "Describe your dizziness."     Felt dizzy with trying to get up from sitting in chair  2. LIGHTHEADED: "Do you feel lightheaded?" (e.g., somewhat faint, woozy, weak upon standing)     Felt like he could faint 3. VERTIGO: "Do you feel like either you or the room is spinning or tilting?" (i.e. vertigo)    Denied 4. SEVERITY: "How bad is it?"  "Do you feel like you are going to faint?"  "Can you stand and walk?"   - MILD - walking normally   - MODERATE - interferes with normal activities (e.g., work, school)    - SEVERE - unable to stand, requires support to walk, feels like passing out now.      mild 5. ONSET:  "When did the dizziness begin?"     This morning when he got up from chair 6. AGGRAVATING FACTORS: "Does anything make it worse?" (e.g., standing, change in head position)     Moving from sitting to standing 7. HEART RATE: "Can you tell me your heart rate?" "How many beats in 15 seconds?"  (Note: not all patients can do this)       Per BP monitor, heart rate 93 and 103. 8. CAUSE: "What do you think is causing the dizziness?"     Unknown; thought it was related to blood sugar 9. RECURRENT SYMPTOM: "Have you had dizziness before?" If so, ask: "When was the last time?" "What happened that time?"     Yes, has been having dizziness 10. OTHER SYMPTOMS: "Do you have any other symptoms?" (e.g., fever, chest pain, vomiting, diarrhea, bleeding)       C/o dizziness with moving from sitting to standing, denied nausea, vomiting, shortness of breath, or chest pain; has chronic diarrhea.   11. PREGNANCY: "Is there any chance you are pregnant?" "When was your last menstrual period?"       N/a  Protocols  used: Ned Grace

## 2018-06-11 ENCOUNTER — Ambulatory Visit: Payer: Medicare Other | Admitting: Family Medicine

## 2018-06-12 ENCOUNTER — Telehealth: Payer: Self-pay | Admitting: Family Medicine

## 2018-06-12 ENCOUNTER — Telehealth: Payer: Self-pay

## 2018-06-12 NOTE — Telephone Encounter (Signed)
Reviewed bp readings that daughter dropped off from home ranging from 130-150's, mostly around 140's, advised to continue off bp medication except for hydralazine prn, he was very dizzy with lower bp , keep follow up with cardiologist

## 2018-06-12 NOTE — Telephone Encounter (Signed)
Daughter came in concerned about her father BP medication and brought in a recent log. States he is going to see a cardiologist on June 24, 2018 but the Hydralazine is not controlling his BP. She wanted to know what what his BP needs to be and what is too high.

## 2018-06-12 NOTE — Telephone Encounter (Signed)
When patient is taking Hydralazine 10mg  it did not help BP. Readings are from 140/88 to 166/113. Patient would like to be put back on HCTZ 50 MG and Candesartan 32mg . Pharm is [facility]

## 2018-06-12 NOTE — Telephone Encounter (Signed)
Copied from [place]. Topic: Referral - Status >> Jun 12, 2018  5:39 PM Simone Curia D wrote:  10/13/2895 Attempted to call patient voicemail was not set-up unable to leave message.  Will attempt to call again 06/13/2018.MA

## 2018-06-13 ENCOUNTER — Telehealth: Payer: Self-pay

## 2018-06-13 NOTE — Telephone Encounter (Signed)
Copied from [place] [id number]. Topic: Referral - Status >> Jun 13, 2018  1:58 PM Simone Curia D wrote:  10/13/2572 Attempted to contact patient on home and cell number was unable to leave message voicemail was not set-up.  I will attempt to call again 06/14/2018.MA

## 2018-06-13 NOTE — Telephone Encounter (Signed)
Patient daughter was notified and wanted to say thank you for time to review his BP readings and explaining.

## 2018-06-14 ENCOUNTER — Ambulatory Visit (INDEPENDENT_AMBULATORY_CARE_PROVIDER_SITE_OTHER): Payer: Medicare Other | Admitting: Gastroenterology

## 2018-06-14 ENCOUNTER — Encounter: Payer: Self-pay | Admitting: Gastroenterology

## 2018-06-14 VITALS — BP 111/80 | HR 98 | Resp 17 | Ht 68.0 in | Wt 133.8 lb

## 2018-06-14 DIAGNOSIS — K8689 Other specified diseases of pancreas: Secondary | ICD-10-CM

## 2018-06-14 DIAGNOSIS — K859 Acute pancreatitis without necrosis or infection, unspecified: Secondary | ICD-10-CM

## 2018-06-14 DIAGNOSIS — K279 Peptic ulcer, site unspecified, unspecified as acute or chronic, without hemorrhage or perforation: Secondary | ICD-10-CM | POA: Diagnosis not present

## 2018-06-14 NOTE — Progress Notes (Signed)
Cephas Darby, MD [address]  [place]  [place], [place] [address]  Main: [phone]  Fax: [phone]    Gastroenterology Consultation  Referring Provider:     Steele Sizer, MD Primary Care Physician:  Steele Sizer, MD Primary Gastroenterologist:  Dr. Cephas Darby Reason for Consultation:     History of peptic ulcer disease, acute pancreatitis        HPI:   Bradley Ellis is a 78 y.o. male referred by Dr. Steele Sizer, MD  for consultation & management of recent hospital admission in [place] with epigastric pain.  This was last week of November around Thanksgiving when he was in [place].  Patient was admitted to the hospital for 6 days.  He reported that he underwent upper endoscopy and he was told he had ulcers in his stomach and small at this time.  He was also told that he had acute pancreatitis.  He started on Protonix 40 mg daily.  He reports that his epigastric pain is significantly improved.  He denies black stools, rectal bleeding, nausea or vomiting.  He reports that his appetite is intact.  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  I did not see any follow-up imaging since then  He is also found to have mediastinal mass on chest x-ray, scheduled for CT chest in 06/2018  Patient denies smoking or alcohol use  NSAIDs: None  Antiplts/Anticoagulants/Anti thrombotics: None  GI Procedures: Colonoscopy 02/28/2005 at [facility] - The terminal ileum is normal. This was biopsied.                  - The colon is normal. This was biopsied.                  - Internal small hemorrhoids were found.           Past Medical History:  Diagnosis Date  . Chronic bronchitis (HCC)    not from smoking  . Depression 1961   no major  exacerbations since  . GERD (gastroesophageal reflux disease)   . Hyperlipidemia   . Hypertension   . Laryngeal nodule    benign  . Sleep disorder     Past Surgical History:  Procedure Laterality Date  . BREAST CYST EXCISION Bilateral    also in groin and by rectum at other times (7)  . CERVICAL DISCECTOMY  [id number]  . ELBOW ARTHROPLASTY Left 1962  . KNEE ARTHROSCOPY Left    2 arthroscopy. Another surgery due to infection (cactus needle)  . NEPHRECTOMY Right 1998   for cancer. Done at [facility]. No other Rx  . PROSTATE SURGERY     about 15 yrs ago  . SKIN CANCER EXCISION     several in various places  . TONSILLECTOMY AND ADENOIDECTOMY  1962    Current Outpatient Medications:  .  hydrALAZINE (APRESOLINE) 10 MG tablet, Take 1 tablet (10 mg total) by mouth 3 (three) times daily as needed. bp above 150/90, Disp: 90 tablet, Rfl: 0 .  pantoprazole (PROTONIX) 40 MG tablet, Take 1 tablet (40 mg total) by mouth daily., Disp: 90 tablet, Rfl: 0 .  traZODone (DESYREL) 50 MG tablet, Take 1 tablet (50 mg total) by mouth at bedtime., Disp: 90 tablet, Rfl: 0 .  aspirin EC 81 MG tablet, Take 81 mg by mouth daily., Disp: , Rfl:    Family History  Problem Relation Age of Onset  . Cancer Mother        breast cancer  . Heart disease Sister   . Stroke Sister   . Diabetes Sister   . Diabetes Maternal Grandmother      Social History   Tobacco Use  . Smoking status: Never Smoker  . Smokeless tobacco: Never Used  Substance Use Topics  . Alcohol use: No  . Drug use: No    Allergies as of 06/14/2018 - Review Complete 06/14/2018  Allergen Reaction Noted  . Sulfa antibiotics Rash 07/30/2013    Review of Systems:    All systems reviewed and negative except where noted in HPI.   Physical Exam:  BP 111/80 (BP Location: Left Arm, Patient Position: Sitting, Cuff Size: Normal)   Pulse 98   Resp 17   Ht 5' 8"  (1.727 m)   Wt 133 lb 12.8 oz (60.7 kg)   BMI 20.34 kg/m  No LMP for male  patient.  General:   Alert,  Well-developed, well-nourished, pleasant and cooperative in NAD Head:  Normocephalic and atraumatic. Eyes:  Sclera clear, no icterus.   Conjunctiva pink. Ears:  Normal auditory acuity. Nose:  No deformity, discharge, or lesions. Mouth:  No deformity or lesions,oropharynx pink & moist. Neck:  Supple; no masses or thyromegaly. Lungs:  Respirations even and unlabored.  Clear throughout to auscultation.   No wheezes, crackles, or rhonchi. No acute distress. Heart:  Regular rate and rhythm; no murmurs, clicks, rubs, or gallops. Abdomen:  Normal bowel sounds. Soft, mild tenderness and fullness in the epigastric region without masses, hepatosplenomegaly or hernias noted.  No guarding or rebound tenderness.   Rectal: Not performed Msk:  Symmetrical without gross deformities. Good, equal movement & strength bilaterally. Pulses:  Normal pulses noted. Extremities:  No clubbing or edema.  No cyanosis. Neurologic:  Alert and oriented x3;  grossly normal neurologically. Skin:  Intact without significant lesions or rashes. No jaundice. Psych:  Alert and cooperative. Normal mood and affect.  Imaging Studies: Reviewed  Assessment and Plan:   Bradley Ellis is a 78 y.o. male with no significant past medical history, recent hospitalization in [place] with epigastric pain and was told that he had peptic ulcer disease and acute pancreatitis, discharged home on Protonix 40 mg daily.  His epigastric pain has significantly improved.   Continue Protonix 40 mg daily We will try to obtain recent EGD report from [place], [place] MRI/MRCP to evaluate pancreas given the mildly prominent pancreatic duct in the head of the pancreas based on imaging in 2013   Follow up in 4 weeks   Cephas Darby, MD

## 2018-06-18 ENCOUNTER — Ambulatory Visit: Payer: Medicare Other | Admitting: Family Medicine

## 2018-06-19 DIAGNOSIS — Z85528 Personal history of other malignant neoplasm of kidney: Secondary | ICD-10-CM | POA: Diagnosis not present

## 2018-06-19 DIAGNOSIS — F329 Major depressive disorder, single episode, unspecified: Secondary | ICD-10-CM | POA: Diagnosis not present

## 2018-06-19 DIAGNOSIS — I4519 Other right bundle-branch block: Secondary | ICD-10-CM | POA: Diagnosis not present

## 2018-06-19 DIAGNOSIS — K861 Other chronic pancreatitis: Secondary | ICD-10-CM | POA: Diagnosis not present

## 2018-06-19 DIAGNOSIS — R1013 Epigastric pain: Secondary | ICD-10-CM | POA: Diagnosis not present

## 2018-06-19 DIAGNOSIS — Q248 Other specified congenital malformations of heart: Secondary | ICD-10-CM | POA: Diagnosis not present

## 2018-06-19 DIAGNOSIS — F0390 Unspecified dementia without behavioral disturbance: Secondary | ICD-10-CM | POA: Diagnosis not present

## 2018-06-19 DIAGNOSIS — R911 Solitary pulmonary nodule: Secondary | ICD-10-CM | POA: Diagnosis not present

## 2018-06-19 DIAGNOSIS — R9431 Abnormal electrocardiogram [ECG] [EKG]: Secondary | ICD-10-CM | POA: Diagnosis not present

## 2018-06-19 DIAGNOSIS — R918 Other nonspecific abnormal finding of lung field: Secondary | ICD-10-CM | POA: Diagnosis not present

## 2018-06-19 DIAGNOSIS — J45909 Unspecified asthma, uncomplicated: Secondary | ICD-10-CM | POA: Diagnosis not present

## 2018-06-19 DIAGNOSIS — K279 Peptic ulcer, site unspecified, unspecified as acute or chronic, without hemorrhage or perforation: Secondary | ICD-10-CM | POA: Diagnosis not present

## 2018-06-19 DIAGNOSIS — R42 Dizziness and giddiness: Secondary | ICD-10-CM | POA: Diagnosis not present

## 2018-06-19 DIAGNOSIS — R748 Abnormal levels of other serum enzymes: Secondary | ICD-10-CM | POA: Diagnosis not present

## 2018-06-19 DIAGNOSIS — I480 Paroxysmal atrial fibrillation: Secondary | ICD-10-CM | POA: Diagnosis not present

## 2018-06-19 DIAGNOSIS — I4510 Unspecified right bundle-branch block: Secondary | ICD-10-CM | POA: Diagnosis not present

## 2018-06-19 DIAGNOSIS — I10 Essential (primary) hypertension: Secondary | ICD-10-CM | POA: Diagnosis not present

## 2018-06-19 DIAGNOSIS — Z882 Allergy status to sulfonamides status: Secondary | ICD-10-CM | POA: Diagnosis not present

## 2018-06-19 DIAGNOSIS — R634 Abnormal weight loss: Secondary | ICD-10-CM | POA: Diagnosis not present

## 2018-06-19 DIAGNOSIS — K0889 Other specified disorders of teeth and supporting structures: Secondary | ICD-10-CM | POA: Diagnosis not present

## 2018-06-19 DIAGNOSIS — R531 Weakness: Secondary | ICD-10-CM | POA: Diagnosis not present

## 2018-06-19 DIAGNOSIS — I248 Other forms of acute ischemic heart disease: Secondary | ICD-10-CM | POA: Diagnosis not present

## 2018-06-19 DIAGNOSIS — Z905 Acquired absence of kidney: Secondary | ICD-10-CM | POA: Diagnosis not present

## 2018-06-19 DIAGNOSIS — Z79899 Other long term (current) drug therapy: Secondary | ICD-10-CM | POA: Diagnosis not present

## 2018-06-19 DIAGNOSIS — Z7982 Long term (current) use of aspirin: Secondary | ICD-10-CM | POA: Diagnosis not present

## 2018-06-19 DIAGNOSIS — I714 Abdominal aortic aneurysm, without rupture: Secondary | ICD-10-CM | POA: Diagnosis not present

## 2018-06-19 DIAGNOSIS — B9729 Other coronavirus as the cause of diseases classified elsewhere: Secondary | ICD-10-CM | POA: Diagnosis not present

## 2018-06-20 DIAGNOSIS — R5383 Other fatigue: Secondary | ICD-10-CM | POA: Diagnosis not present

## 2018-06-20 DIAGNOSIS — R7989 Other specified abnormal findings of blood chemistry: Secondary | ICD-10-CM | POA: Diagnosis not present

## 2018-06-20 DIAGNOSIS — R634 Abnormal weight loss: Secondary | ICD-10-CM | POA: Diagnosis not present

## 2018-06-20 DIAGNOSIS — Q248 Other specified congenital malformations of heart: Secondary | ICD-10-CM | POA: Insufficient documentation

## 2018-06-20 DIAGNOSIS — K279 Peptic ulcer, site unspecified, unspecified as acute or chronic, without hemorrhage or perforation: Secondary | ICD-10-CM | POA: Insufficient documentation

## 2018-06-20 DIAGNOSIS — R1013 Epigastric pain: Secondary | ICD-10-CM | POA: Diagnosis not present

## 2018-06-20 DIAGNOSIS — I480 Paroxysmal atrial fibrillation: Secondary | ICD-10-CM | POA: Diagnosis not present

## 2018-06-20 DIAGNOSIS — R1011 Right upper quadrant pain: Secondary | ICD-10-CM | POA: Diagnosis not present

## 2018-06-21 ENCOUNTER — Telehealth: Payer: Self-pay

## 2018-06-21 DIAGNOSIS — R1011 Right upper quadrant pain: Secondary | ICD-10-CM | POA: Diagnosis not present

## 2018-06-21 DIAGNOSIS — I4891 Unspecified atrial fibrillation: Secondary | ICD-10-CM | POA: Diagnosis not present

## 2018-06-21 DIAGNOSIS — Q248 Other specified congenital malformations of heart: Secondary | ICD-10-CM | POA: Diagnosis not present

## 2018-06-21 DIAGNOSIS — B342 Coronavirus infection, unspecified: Secondary | ICD-10-CM | POA: Diagnosis not present

## 2018-06-21 DIAGNOSIS — R748 Abnormal levels of other serum enzymes: Secondary | ICD-10-CM | POA: Diagnosis not present

## 2018-06-21 DIAGNOSIS — R1013 Epigastric pain: Secondary | ICD-10-CM | POA: Diagnosis not present

## 2018-06-21 DIAGNOSIS — R7989 Other specified abnormal findings of blood chemistry: Secondary | ICD-10-CM | POA: Diagnosis not present

## 2018-06-21 DIAGNOSIS — I739 Peripheral vascular disease, unspecified: Secondary | ICD-10-CM | POA: Diagnosis not present

## 2018-06-21 DIAGNOSIS — R531 Weakness: Secondary | ICD-10-CM | POA: Diagnosis not present

## 2018-06-21 MED ORDER — METOPROLOL SUCCINATE ER 25 MG PO TB24
50.00 | ORAL_TABLET | ORAL | Status: DC
Start: ? — End: 2018-06-21

## 2018-06-21 MED ORDER — TRAZODONE HCL 50 MG PO TABS
25.00 | ORAL_TABLET | ORAL | Status: DC
Start: 2018-06-21 — End: 2018-06-21

## 2018-06-21 MED ORDER — PANTOPRAZOLE SODIUM 20 MG PO TBEC
40.00 | DELAYED_RELEASE_TABLET | ORAL | Status: DC
Start: 2018-06-21 — End: 2018-06-21

## 2018-06-21 MED ORDER — ASPIRIN 81 MG PO CHEW
81.00 | CHEWABLE_TABLET | ORAL | Status: DC
Start: 2018-06-22 — End: 2018-06-21

## 2018-06-21 NOTE — Telephone Encounter (Signed)
Copied from [place]. Topic: Referral - Status >> Jun 21, 2018  8:34 PM Simone Curia D wrote:     1/96/2229 Attempted to call, could not leave msg vm not set-up. Will attempt to call again 06/24/2018.MA

## 2018-06-24 ENCOUNTER — Telehealth: Payer: Self-pay | Admitting: Family Medicine

## 2018-06-24 ENCOUNTER — Ambulatory Visit: Admission: RE | Admit: 2018-06-24 | Payer: Medicare Other | Source: Ambulatory Visit

## 2018-06-24 NOTE — Telephone Encounter (Signed)
Copied from [place] [id number]. Topic: Quick Communication - See Telephone Encounter >> Jun 24, 2018 10:15 AM Rayann Heman wrote: CRM for notification. See Telephone encounter for: 06/24/18. Pt called and stated that he has an appointment today with a cardiologist. I do not see anything in his chart regarding cardiology. Please advise

## 2018-06-25 ENCOUNTER — Ambulatory Visit
Admission: RE | Admit: 2018-06-25 | Discharge: 2018-06-25 | Disposition: A | Discharge status: Home / Self Care | Payer: Medicare Other | Source: Ambulatory Visit | Attending: Gastroenterology | Admitting: Gastroenterology

## 2018-06-25 ENCOUNTER — Other Ambulatory Visit: Payer: Self-pay | Admitting: Gastroenterology

## 2018-06-25 DIAGNOSIS — K769 Liver disease, unspecified: Secondary | ICD-10-CM | POA: Diagnosis not present

## 2018-06-25 DIAGNOSIS — K8689 Other specified diseases of pancreas: Secondary | ICD-10-CM

## 2018-06-25 DIAGNOSIS — R935 Abnormal findings on diagnostic imaging of other abdominal regions, including retroperitoneum: Secondary | ICD-10-CM | POA: Diagnosis not present

## 2018-06-25 MED ORDER — GADOBUTROL 1 MMOL/ML IV SOLN
6.0000 mL | Freq: Once | INTRAVENOUS | Status: AC | PRN
  Administered 2018-06-25: 6 mL via INTRAVENOUS

## 2018-07-03 ENCOUNTER — Telehealth: Payer: Self-pay

## 2018-07-03 NOTE — Telephone Encounter (Signed)
Copied from [place] [phone]. Topic: Referral - Status >> Jul 03, 2018  4:10 PM Simone Curia D wrote: 07/06/3141 pt unable to come to phone spoke with pt's daughter Yeshaya Vath (per DPR) about [employer] Patient Assistance Program for Incruse and the Medication Assistance Program (MAP).MA

## 2018-07-15 ENCOUNTER — Ambulatory Visit (INDEPENDENT_AMBULATORY_CARE_PROVIDER_SITE_OTHER): Payer: Medicare Other | Admitting: Gastroenterology

## 2018-07-15 ENCOUNTER — Other Ambulatory Visit: Payer: Self-pay

## 2018-07-15 ENCOUNTER — Encounter: Payer: Self-pay | Admitting: Gastroenterology

## 2018-07-15 VITALS — BP 147/88 | HR 77 | Resp 17 | Ht 68.0 in | Wt 136.2 lb

## 2018-07-15 DIAGNOSIS — K279 Peptic ulcer, site unspecified, unspecified as acute or chronic, without hemorrhage or perforation: Secondary | ICD-10-CM

## 2018-07-15 NOTE — Progress Notes (Signed)
Cephas Darby, MD [address]  [place]  [place], [place] [address]  Main: [phone]  Fax: [phone]    Gastroenterology Consultation  Referring Provider:     Steele Sizer, MD Primary Care Physician:  Steele Sizer, MD Primary Gastroenterologist:  Dr. Cephas Darby Reason for Consultation:     History of peptic ulcer disease, acute pancreatitis        HPI:   Bradley Ellis is a 78 y.o. male referred by Dr. Steele Sizer, MD  for consultation & management of recent hospital admission in [place] with epigastric pain.  This was last week of November around Thanksgiving when he was in [place].  Patient was admitted to the hospital for 6 days.  He reported that he underwent upper endoscopy and he was told he had ulcers in his stomach and small at this time.  He was also told that he had acute pancreatitis.  He started on Protonix 40 mg daily.  He reports that his epigastric pain is significantly improved.  He denies black stools, rectal bleeding, nausea or vomiting.  He reports that his appetite is intact.  His most recent labs about a week ago, including CBC, CMP, ESR, CRP, TSH, lipase, hemoglobin A1c were unremarkable  Patient had CT and MRI for abdominal pain in 04/2012 at [facility] which revealed mild prominence of the pancreatic duct in head of the pancreas.  I did not see any follow-up imaging since then  He is also found to have mediastinal mass on chest x-ray, scheduled for CT chest in 06/2018  Patient denies smoking or alcohol use  Follow-up visit 07/15/2018 He reports mild epigastric pain, taking protonix 104m daily. He underwent MR abdomen and MRCP which was unremarkable. He gained weight. We did not receive the [facility] [place] [facility]   NSAIDs: None  Antiplts/Anticoagulants/Anti thrombotics: None  GI Procedures: [facility]- The terminal ileum is normal. This was biopsied.                  - The  colon is normal. This was biopsied.                  - Internal small hemorrhoids were found.           Past Medical History:  Diagnosis Date  . Chronic bronchitis (HCC)    not from smoking  . 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 GERD (gastroesophageal reflux disease)   . Hyperlipidemia   . Hypertension   . Laryngeal nodule    benign  . Sleep disorder     Past Surgical History:  Procedure Laterality Date  . BREAST CYST EXCISION Bilateral    also in groin and by rectum at other times (7)  . CERVICAL DISCECTOMY  [id number] . ELBOW ARTHROPLASTY Left 1962  . KNEE ARTHROSCOPY Left    2 arthroscopy. Another surgery due to infection (cactus needle)  . NEPHRECTOMY Right 1998   [facility]. [facility] No other Rx  . PROSTATE SURGERY     about 15 yrs ago  . SKIN CANCER EXCISION     several in various places  . TONSILLECTOMY AND ADENOIDECTOMY  1962    Current Outpatient Medications:  .  aspirin EC 81 MG tablet, Take 81 mg by mouth daily., Disp: , Rfl:  .  metoprolol succinate (TOPROL-XL) 100 MG 24 hr tablet, TAKE 1 2 (ONE HALF) TABLET BY MOUTH  TWICE DAILY, Disp: , Rfl:  .  pantoprazole (PROTONIX) 40 MG tablet, Take 1 tablet (40 mg total) by mouth daily., Disp: 90 tablet, Rfl: 0 .  traZODone (DESYREL) 50 MG tablet, Take 1 tablet (50 mg total) by mouth at bedtime., Disp: 90 tablet, Rfl: 0 .  hydrALAZINE (APRESOLINE) 10 MG tablet, Take 1 tablet (10 mg total) by mouth 3 (three) times daily as needed. bp above 150/90 (Patient not taking: Reported on 07/15/2018), Disp: 90 tablet, Rfl: 0   Family History  Problem Relation Age of Onset  . Cancer Mother        breast cancer  . Heart disease Sister   . Stroke Sister   . Diabetes Sister   . Diabetes Maternal Grandmother      Social History   Tobacco Use  . Smoking status: Never Smoker  . Smokeless tobacco: Never Used  Substance Use Topics  . Alcohol use: No  . Drug use: No    Allergies as of 07/15/2018 -  Review Complete 07/15/2018  Allergen Reaction Noted  . Sulfa antibiotics Rash and Hives 07/30/2013    Review of Systems:    All systems reviewed and negative except where noted in HPI.   Physical Exam:  BP (!) 147/88 (BP Location: Left Arm, Patient Position: Sitting, Cuff Size: Normal)   Pulse 77   Resp 17   Ht _0  (1.727 m)   Wt 136 lb 3.2 oz (61.8 kg)   BMI 20.71 kg/m  No LMP for male patient.  General:   Alert,  Well-developed, well-nourished, pleasant and cooperative in NAD Head:  Normocephalic and atraumatic. Eyes:  Sclera clear, no icterus.   Conjunctiva pink. Ears:  Normal auditory acuity. Nose:  No deformity, discharge, or lesions. Mouth:  No deformity or lesions,oropharynx pink & moist. Neck:  Supple; no masses or thyromegaly. Lungs:  Respirations even and unlabored.  Clear throughout to auscultation.   No wheezes, crackles, or rhonchi. No acute distress. Heart:  Regular rate and rhythm; no murmurs, clicks, rubs, or gallops. Abdomen:  Normal bowel sounds. Soft, nontender without masses, hepatosplenomegaly or hernias noted.  No guarding or rebound tenderness.   Rectal: Not performed Msk:  Symmetrical without gross deformities. Good, equal movement & strength bilaterally. Pulses:  Normal pulses noted. Extremities:  No clubbing or edema.  No cyanosis. Neurologic:  Alert and oriented x3;  grossly normal neurologically. Skin:  Intact without significant lesions or rashes. No jaundice. Psych:  Alert and cooperative. Normal mood and affect.  Imaging Studies: Reviewed  Assessment and Plan:   Bradley Ellis is a 78 y.o. male with no significant past medical history, recent hospitalization in [place] with epigastric pain and was told that he had peptic ulcer disease and acute pancreatitis, discharged home on Protonix 32m daily.  His epigastric pain has significantly improved.   History of peptic ulcer disease Continue Protonix 40 mg daily Told patient since he had  peptic ulcer and unclear about the location whether it was in stomach or duodenum and we could not obtain his recent [place] [facility] I recommend for repeat EGD and he is agreeable He will continue Protonix 40 mg daily  Dilated pancreatic duct in 2013 Repeat MRI/MRCP is unremarkable  Follow up based on the EGD results   RCephas Darby MD

## 2018-07-17 ENCOUNTER — Ambulatory Visit (INDEPENDENT_AMBULATORY_CARE_PROVIDER_SITE_OTHER): Payer: Medicare Other | Admitting: Family Medicine

## 2018-07-17 ENCOUNTER — Other Ambulatory Visit: Payer: Self-pay

## 2018-07-17 ENCOUNTER — Encounter: Payer: Self-pay | Admitting: Family Medicine

## 2018-07-17 VITALS — BP 168/110 | HR 76 | Temp 97.9°F | Resp 16 | Ht 68.0 in | Wt 136.6 lb

## 2018-07-17 DIAGNOSIS — K551 Chronic vascular disorders of intestine: Secondary | ICD-10-CM | POA: Insufficient documentation

## 2018-07-17 DIAGNOSIS — I318 Other specified diseases of pericardium: Secondary | ICD-10-CM | POA: Diagnosis not present

## 2018-07-17 DIAGNOSIS — I714 Abdominal aortic aneurysm, without rupture, unspecified: Secondary | ICD-10-CM

## 2018-07-17 DIAGNOSIS — K269 Duodenal ulcer, unspecified as acute or chronic, without hemorrhage or perforation: Secondary | ICD-10-CM | POA: Diagnosis not present

## 2018-07-17 DIAGNOSIS — I771 Stricture of artery: Secondary | ICD-10-CM

## 2018-07-17 DIAGNOSIS — I10 Essential (primary) hypertension: Secondary | ICD-10-CM | POA: Diagnosis not present

## 2018-07-17 MED ORDER — CANDESARTAN CILEXETIL 16 MG PO TABS: 16.0000 mg | ORAL_TABLET | Freq: Every day | ORAL | 0 refills | Status: DC

## 2018-07-17 MED ORDER — PANTOPRAZOLE SODIUM 40 MG PO TBEC: 40.0000 mg | DELAYED_RELEASE_TABLET | Freq: Two times a day (BID) | ORAL | 0 refills | Status: DC

## 2018-07-17 NOTE — Progress Notes (Signed)
Name: Bradley Ellis   MRN: [id number]    DOB: 1941/04/20   Date:07/18/2018       Progress Note  Subjective  Chief Complaint  Chief Complaint  Patient presents with  . Follow-up    1 month F/U  . Hypertension    headaches    HPI  Follow up [facility]: he [facility]2018 because of increase in fatigue, weakness, weight loss and was found to have elevated troponin, also on CT chest showed pulmononary nodules and pericardial cyst , CT abdomen showed aneurysm of aorta and stenosis of of the SMA . He was supposed to follow up with cardiologist for stress test and vascular surgeon but did not get appointments yet. He states he would like to see vascular surgeon first. He states he still has some sob but no chest pain or palpitation. He is seeing Dr. Marius Ditch and will have EGD in two weeks for evaluation of abdominal pain, he is currently taking pantoprazole BID   Malnutrition: doing better, taking PPI BID able to eat a little more and gained 2 lbs since last visit, but still much smaller than one year ago  HTN: while at [place] was stopped so he could start metoprolol XL 100 mg daily however bp has been elevated since, we will resume 16 mg of atacand today and advised to return in a few weeks for follow up or sooner if needed.   Insomnia: doing well on Trazodone half pill at night, sleeping better, still gets up to void, but wakes up twice to void during the night.   Patient Active Problem List   Diagnosis Date Noted  . 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 PUD (peptic ulcer disease) 06/20/2018  . 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 Mild protein-calorie malnutrition ([place]) 06/06/2018  . Primary insomnia 04/29/2018  . History of kidney cancer 04/29/2018  . 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 Chronic kidney disease, stage III (moderate) ([place]) 04/29/2018  . Diastolic dysfunction 52/12/221  . Mild mitral regurgitation 04/17/2018  . Tricuspid regurgitation 04/17/2018  . Duodenal ulcer  without hemorrhage or perforation 04/09/2018  . Neuropathy 04/09/2018  . Paroxysmal A-fib ([place]) 04/03/2018  . Aneurysm of abdominal aorta (HCC) 04/03/2018  . Pain in both lower extremities 11/19/2014  . Major depression in remission ([place]) 11/18/2014  . Sleep disorder   . Hypertension   . GERD (gastroesophageal reflux disease)   . Chronic bronchitis ([place])   . Hyperlipidemia     Past Surgical History:  Procedure Laterality Date  . BREAST CYST EXCISION Bilateral    also in groin and by rectum at other times (7)  . CERVICAL DISCECTOMY  [id number]  . ELBOW ARTHROPLASTY Left 1962  . KNEE ARTHROSCOPY Left    2 arthroscopy. Another surgery due to infection (cactus needle)  . NEPHRECTOMY Right 1998   for cancer. Done at [facility]. No other Rx  . PROSTATE SURGERY     about 15 yrs ago  . SKIN CANCER EXCISION     several in various places  . TONSILLECTOMY AND ADENOIDECTOMY  1962    Family History  Problem Relation Age of Onset  . Cancer Mother        breast cancer  . Heart disease Sister   . Stroke Sister   . Diabetes Sister   . Diabetes Maternal Grandmother     Social History   Socioeconomic History  . Marital status: Married    Spouse name: Salena Saner  . Number of children:  2  . Years of education: Not on file  . Highest education level: Some college, no degree  Occupational History  . Occupation: [employer]  . Financial resource strain: Somewhat hard  . Food insecurity:    Worry: Never true    Inability: Never true  . Transportation needs:    Medical: No    Non-medical: No  Tobacco Use  . Smoking status: Never Smoker  . Smokeless tobacco: Never Used  Substance and Sexual Activity  . Alcohol use: No  . Drug use: No  . Sexual activity: Not on file  Lifestyle  . Physical activity:    Days per week: 0 days    Minutes per session: 0 min  . Stress: Rather much  Relationships  . Social connections:    Talks on phone: More than three times a  week    Gets together: More than three times a week    Attends religious service: More than 4 times per year    Active member of club or organization: Yes    Attends meetings of clubs or organizations: More than 4 times per year    Relationship status: Married  . Intimate partner violence:    Fear of current or ex partner: No    Emotionally abused: No    Physically abused: No    Forced sexual activity: No  Other Topics Concern  . 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  aspirin EC 81 MG tablet, Take 81 mg by mouth daily., Disp: , Rfl:  .  metoprolol succinate (TOPROL-XL) 100 MG 24 hr tablet, Take 0.5 mg by mouth 2 (two) times daily. , Disp: , Rfl:  .  pantoprazole (PROTONIX) 40 MG tablet, Take 1 tablet (40 mg total) by mouth 2 (two) times daily., Disp: 180 tablet, Rfl: 0 .  traZODone (DESYREL) 50 MG tablet, Take 1 tablet (50 mg total) by mouth at bedtime. (Patient taking differently: Take 0.5 mg by mouth at bedtime. ), Disp: 90 tablet, Rfl: 0 .  candesartan (ATACAND) 16 MG tablet, Take 1 tablet (16 mg total) by mouth daily., Disp: 30 tablet, Rfl: 0 .  hydrALAZINE (APRESOLINE) 10 MG tablet, Take 1 tablet (10 mg total) by mouth 3 (three) times daily as needed. bp above 150/90 (Patient not taking: Reported on 07/15/2018), Disp: 90 tablet, Rfl: 0  Allergies  Allergen Reactions  . Sulfa Antibiotics Rash and Hives    I personally reviewed active problem list, medication list, allergies, family history, social history with the patient/caregiver today.   ROS  Constitutional: Negative for fever , still has  weight change.  Respiratory: Negative for cough, positive for  shortness of breath.   Cardiovascular: Negative for chest pain or palpitations.  Gastrointestinal: Positive  for abdominal pain, no  bowel changes.  Musculoskeletal: Negative for gait problem or joint swelling.  Skin: Negative for rash.  Neurological: Negative for dizziness, he states very little  Headaches now.  No other specific complaints in a complete review of systems (except as listed in HPI above).  Objective  Vitals:   07/17/18 1148 07/17/18 1232  BP: (!) 172/96 (!) 168/110  Pulse: 76   Resp: 16   Temp: 97.9 F (36.6 C)   TempSrc: Oral   SpO2: 99%  Weight: 136 lb 9.6 oz (62 kg)   Height: 5' 8"  (1.727 m)     Body mass index is 20.77 kg/m.  Physical Exam  Constitutional: Patient appears well-developed and thin. Some temporal waisting noticed.  No distress.  HEENT: head atraumatic, normocephalic, pupils equal and reactive to light,  neck supple, throat within normal limits Cardiovascular: Normal rate, regular rhythm and normal heart sounds.  No murmur heard. No BLE edema. Pulmonary/Chest: Effort normal and breath sounds normal. No respiratory distress. Abdominal: Soft.  There is positive for epigastric  Tenderness. Normal bowel sounds, no masses  Psychiatric: Patient has a normal mood and affect. behavior is normal. Judgment and thought content normal.  Recent Results (from the past 2160 hour(s))  CBC w/Diff/Platelet     Status: None   Collection Time: 06/05/18  3:42 PM  Result Value Ref Range   WBC 7.4 3.8 - 10.8 Thousand/uL   RBC 5.44 4.20 - 5.80 Million/uL   Hemoglobin 15.6 13.2 - 17.1 g/dL   HCT 47.9 38.5 - 50.0 %   MCV 88.1 80.0 - 100.0 fL   MCH 28.7 27.0 - 33.0 pg   MCHC 32.6 32.0 - 36.0 g/dL   RDW 13.4 11.0 - 15.0 %   Platelets 246 140 - 400 Thousand/uL   MPV 10.6 7.5 - 12.5 fL   Neutro Abs 4,440 1,500 - 7,800 cells/uL   Lymphs Abs 2,250 850 - 3,900 cells/uL   Absolute Monocytes 599 200 - 950 cells/uL   Eosinophils Absolute 104 15 - 500 cells/uL   Basophils Absolute 7 0 - 200 cells/uL   Neutrophils Relative % 60 %   Total Lymphocyte 30.4 %   Monocytes Relative 8.1 %   Eosinophils  Relative 1.4 %   Basophils Relative 0.1 %  COMPLETE METABOLIC PANEL WITH GFR     Status: Abnormal   Collection Time: 06/05/18  3:42 PM  Result Value Ref Range   Glucose, Bld 84 65 - 99 mg/dL    Comment: .            Fasting reference interval .    BUN 20 7 - 25 mg/dL   Creat 1.36 (H) 0.70 - 1.18 mg/dL    Comment: For patients >19 years of age, the reference limit for Creatinine is approximately 13% higher for people identified as African-American. .    GFR, Est Non African American 49 (L) > OR = 60 mL/min/1.63m   GFR, Est African American 57 (L) > OR = 60 mL/min/1.75m  BUN/Creatinine Ratio 15 6 - 22 (calc)   Sodium 142 135 - 146 mmol/L   Potassium 4.7 3.5 - 5.3 mmol/L   Chloride 101 98 - 110 mmol/L   CO2 30 20 - 32 mmol/L   Calcium 9.7 8.6 - 10.3 mg/dL   Total Protein 6.7 6.1 - 8.1 g/dL   Albumin 4.2 3.6 - 5.1 g/dL   Globulin 2.5 1.9 - 3.7 g/dL (calc)   AG Ratio 1.7 1.0 - 2.5 (calc)   Total Bilirubin 0.7 0.2 - 1.2 mg/dL   Alkaline phosphatase (APISO) 74 40 - 115 U/L   AST 14 10 - 35 U/L   ALT 15 9 - 46 U/L  TSH     Status: None   Collection Time: 06/05/18  3:42 PM  Result Value Ref Range   TSH 3.63 0.40 - 4.50 mIU/L  Sed Rate (ESR)     Status: None   Collection Time: 06/05/18  3:42 PM  Result Value Ref Range  Sed Rate 6 0 - 20 mm/h  C-reactive protein     Status: None   Collection Time: 06/05/18  3:42 PM  Result Value Ref Range   CRP 6.7 <8.0 mg/L  HgB A1c     Status: Abnormal   Collection Time: 06/05/18  3:42 PM  Result Value Ref Range   Hgb A1c MFr Bld 5.7 (H) <5.7 % of total Hgb    Comment: For someone without known diabetes, a hemoglobin  A1c value between 5.7% and 6.4% is consistent with prediabetes and should be confirmed with a  follow-up test. . For someone with known diabetes, a value <7% indicates that their diabetes is well controlled. A1c targets should be individualized based on duration of diabetes, age, comorbid conditions, and  other considerations. . This assay result is consistent with an increased risk of diabetes. . Currently, no consensus exists regarding use of hemoglobin A1c for diagnosis of diabetes for children. .    Mean Plasma Glucose 117 (calc)   eAG (mmol/L) 6.5 (calc)     PHQ2/9: Depression screen [facility] 2/9 07/17/2018 04/09/2018  Decreased Interest 1 0  Down, Depressed, Hopeless 1 0  PHQ - 2 Score 2 0  Altered sleeping 0 3  Tired, decreased energy 1 3  Change in appetite 0 3  Feeling bad or failure about yourself  0 0  Trouble concentrating 0 0  Moving slowly or fidgety/restless 0 0  Suicidal thoughts 0 0  PHQ-9 Score 3 9  Difficult doing work/chores Not difficult at all Not difficult at all     Fall Risk: Fall Risk  04/29/2018 04/09/2018  Falls in the past year? 0 0  Number falls in past yr: 0 0  Injury with Fall? 0 0    Functional Status Survey: Is the patient deaf or have difficulty hearing?: Yes Does the patient have difficulty seeing, even when wearing glasses/contacts?: Yes Does the patient have difficulty concentrating, remembering, or making decisions?: No Does the patient have difficulty walking or climbing stairs?: No Does the patient have difficulty dressing or bathing?: No Does the patient have difficulty doing errands alone such as visiting a doctor's office or shopping?: No   Assessment & Plan  1. Acquired pericardial cyst  - Ambulatory referral to Vascular Surgery  2. Abdominal aortic aneurysm (AAA) without rupture ([place])  - Ambulatory referral to Vascular Surgery  3. Hypertension, benign  - candesartan (ATACAND) 16 MG tablet; Take 1 tablet (16 mg total) by mouth daily.  Dispense: 30 tablet; Refill: 0  4. Uncontrolled hypertension  Resume Atacand, half dose, he states bp not controlled since bp medication changed at [facility]  - candesartan (ATACAND) 16 MG tablet; Take 1 tablet (16 mg total) by mouth daily.  Dispense: 30 tablet; Refill: 0  5. Duodenal ulcer  without hemorrhage or perforation  - pantoprazole (PROTONIX) 40 MG tablet; Take 1 tablet (40 mg total) by mouth 2 (two) times daily.  Dispense: 180 tablet; Refill: 0   6. SMA stenosis (HCC)  It may be the cause of abdominal pain also, needs follow up with vascular surgeon

## 2018-07-29 ENCOUNTER — Ambulatory Visit: Admit: 2018-07-29 | Payer: Medicare Other | Admitting: Gastroenterology

## 2018-07-29 SURGERY — ESOPHAGOGASTRODUODENOSCOPY (EGD) WITH PROPOFOL
Anesthesia: General

## 2018-08-07 DIAGNOSIS — I771 Stricture of artery: Secondary | ICD-10-CM | POA: Diagnosis not present

## 2018-08-07 DIAGNOSIS — I714 Abdominal aortic aneurysm, without rupture: Secondary | ICD-10-CM | POA: Diagnosis not present

## 2018-08-15 DIAGNOSIS — I4891 Unspecified atrial fibrillation: Secondary | ICD-10-CM | POA: Diagnosis not present

## 2018-08-15 DIAGNOSIS — Z905 Acquired absence of kidney: Secondary | ICD-10-CM | POA: Diagnosis not present

## 2018-08-15 DIAGNOSIS — J45909 Unspecified asthma, uncomplicated: Secondary | ICD-10-CM | POA: Diagnosis not present

## 2018-08-15 DIAGNOSIS — I10 Essential (primary) hypertension: Secondary | ICD-10-CM | POA: Diagnosis not present

## 2018-08-15 DIAGNOSIS — Z7902 Long term (current) use of antithrombotics/antiplatelets: Secondary | ICD-10-CM | POA: Diagnosis not present

## 2018-08-15 DIAGNOSIS — K551 Chronic vascular disorders of intestine: Secondary | ICD-10-CM | POA: Diagnosis not present

## 2018-08-15 DIAGNOSIS — I714 Abdominal aortic aneurysm, without rupture: Secondary | ICD-10-CM | POA: Diagnosis not present

## 2018-08-15 DIAGNOSIS — Z7982 Long term (current) use of aspirin: Secondary | ICD-10-CM | POA: Diagnosis not present

## 2018-09-02 ENCOUNTER — Other Ambulatory Visit: Payer: Self-pay

## 2018-09-02 ENCOUNTER — Ambulatory Visit (INDEPENDENT_AMBULATORY_CARE_PROVIDER_SITE_OTHER): Payer: Medicare Other | Admitting: Family Medicine

## 2018-09-02 ENCOUNTER — Encounter: Payer: Self-pay | Admitting: Family Medicine

## 2018-09-02 VITALS — BP 157/97 | Ht 68.0 in | Wt 136.0 lb

## 2018-09-02 DIAGNOSIS — I771 Stricture of artery: Secondary | ICD-10-CM

## 2018-09-02 DIAGNOSIS — I10 Essential (primary) hypertension: Secondary | ICD-10-CM | POA: Diagnosis not present

## 2018-09-02 DIAGNOSIS — K551 Chronic vascular disorders of intestine: Secondary | ICD-10-CM

## 2018-09-02 MED ORDER — CANDESARTAN CILEXETIL 32 MG PO TABS: 32.0000 mg | ORAL_TABLET | Freq: Every day | ORAL | 0 refills | Status: DC

## 2018-09-02 NOTE — Progress Notes (Signed)
Name: Bradley Ellis   MRN: [id number]    DOB: 20-Aug-1940   Date:09/02/2018       Progress Note  Subjective  Chief Complaint  Chief Complaint  Patient presents with  . Follow-up    1 month F/U  . Hypertension    Already seen Vascular Dr- Denies any symptoms  . Weight Loss    eating 3 meals and 1 snack a day    I connected with  Geronimo Running  on 09/02/18 at  1:40 PM EDT by a video enabled telemedicine application and verified that I am speaking with the correct person using two identifiers.  I discussed the limitations of evaluation and management by telemedicine and the availability of in person appointments. The patient expressed understanding and agreed to proceed. Staff also discussed with the patient that there may be a patient responsible charge related to this service. Patient Location: daughter's home  Provider Location: [facility] Daughter was there with him   HPI  HTN: bp has been elevated at home, he states 45's, he has been taking metoprolol 50 mg BID and also Atacand 16 mg daily, he has not been taking hydralazine prn, we will try adjusting Atacand to 32 mg daily. 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 He states he has been able to eat without abdominal pain two hours after the procedure. Advised to go down on PPI to once a day and monitor. He is also concerned about taking aspirin and plavix, bruising on both arms, no other bleeding. Advised to discuss with vascular surgeon first, but to definitely not stop plavix and if he stops aspirin try taking it at least every other day   Patient Active Problem List   Diagnosis Date Noted  . 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 PUD (peptic ulcer disease) 06/20/2018  . 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 Mild protein-calorie malnutrition ([place]) 06/06/2018  . Primary insomnia 04/29/2018  . History of kidney  cancer 04/29/2018  . 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 Chronic kidney disease, stage III (moderate) ([place]) 04/29/2018  . Diastolic dysfunction 92/33/0076  . Mild mitral regurgitation 04/17/2018  . Tricuspid regurgitation 04/17/2018  . Duodenal ulcer without hemorrhage or perforation 04/09/2018  . Neuropathy 04/09/2018  . Paroxysmal A-fib ([place]) 04/03/2018  . Aneurysm of abdominal aorta (HCC) 04/03/2018  . Pain in both lower extremities 11/19/2014  . Major depression in remission ([place]) 11/18/2014  . Sleep disorder   . Hypertension   . GERD (gastroesophageal reflux disease)   . Chronic bronchitis ([place])   . Hyperlipidemia     Past Surgical History:  Procedure Laterality Date  . BREAST CYST EXCISION Bilateral    also in groin and by rectum at other times (7)  . CERVICAL DISCECTOMY  [id number]  . ELBOW ARTHROPLASTY Left 1962  . KNEE ARTHROSCOPY Left    2 arthroscopy. Another surgery due to infection (cactus needle)  . NEPHRECTOMY Right 1998   for cancer. Done at [facility]. No other Rx  . PROSTATE SURGERY     about 15 yrs ago  . SKIN CANCER EXCISION     several in various places  . TONSILLECTOMY AND ADENOIDECTOMY  1962    Family History  Problem Relation Age of Onset  . Cancer Mother        breast cancer  . Heart disease Sister   . Stroke  Sister   . Diabetes Sister   . Diabetes Maternal Grandmother     Social History   Socioeconomic History  . Marital status: Married    Spouse name: Salena Saner  . Number of children: 2  . Years of education: Not on file  . Highest education level: Some college, no degree  Occupational History  . Occupation: [employer]  . Financial resource strain: Somewhat hard  . Food insecurity:    Worry: Never true    Inability: Never true  . Transportation needs:    Medical: No    Non-medical: No  Tobacco Use  . Smoking status: Never Smoker  . Smokeless tobacco: Never Used  Substance and Sexual Activity  .  Alcohol use: No  . Drug use: No  . Sexual activity: Not on file  Lifestyle  . Physical activity:    Days per week: 0 days    Minutes per session: 0 min  . Stress: Rather much  Relationships  . Social connections:    Talks on phone: More than three times a week    Gets together: More than three times a week    Attends religious service: More than 4 times per year    Active member of club or organization: Yes    Attends meetings of clubs or organizations: More than 4 times per year    Relationship status: Married  . Intimate partner violence:    Fear of current or ex partner: No    Emotionally abused: No    Physically abused: No    Forced sexual activity: No  Other Topics Concern  . 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  candesartan (ATACAND) 16 MG tablet, Take 1 tablet (16 mg total) by mouth daily., Disp: 30 tablet, Rfl: 0 .  clopidogrel (PLAVIX) 75 MG tablet, Take 75 mg by mouth daily., Disp: , Rfl:  .  hydrALAZINE (APRESOLINE) 10 MG tablet, Take 1 tablet (10 mg total) by mouth 3 (three) times daily as needed. bp above 150/90, Disp: 90 tablet, Rfl: 0 .  metoprolol succinate (TOPROL-XL) 100 MG 24 hr tablet, Take 0.5 mg by mouth 2 (two) times daily. , Disp: , Rfl:  .  pantoprazole (PROTONIX) 40 MG tablet, Take 1 tablet (40 mg total) by mouth 2 (two) times daily., Disp: 180 tablet, Rfl: 0 .  traZODone (DESYREL) 50 MG tablet, Take 1 tablet (50 mg total) by mouth at bedtime. (Patient taking differently: Take 0.5 mg by mouth at bedtime. ), Disp: 90 tablet, Rfl: 0 .  aspirin EC 81 MG tablet, Take 81 mg by mouth daily., Disp: , Rfl:   Allergies  Allergen Reactions  . Sulfa Antibiotics Rash and Hives    I personally reviewed active problem list, medication list, allergies, family history with  the patient/caregiver today.   ROS  Ten systems reviewed and is negative except as mentioned in HPI   Objective  Virtual encounter, vitals not obtained.  Body mass index is 20.68 kg/m.  Physical Exam  Awake, alert and oriented, purpura on both arms   PHQ2/9: Depression screen [facility] 2/9 09/02/2018 07/17/2018 04/09/2018  Decreased Interest 0 1 0  Down, Depressed, Hopeless 0 1 0  PHQ - 2 Score 0 2 0  Altered sleeping 0  0 3  Tired, decreased energy 0 1 3  Change in appetite 0 0 3  Feeling bad or failure about yourself  0 0 0  Trouble concentrating 0 0 0  Moving slowly or fidgety/restless 0 0 0  Suicidal thoughts 0 0 0  PHQ-9 Score 0 3 9  Difficult doing work/chores Not difficult at all Not difficult at all Not difficult at all   PHQ-2/9 Result is negative.    Fall Risk: Fall Risk  09/02/2018 04/29/2018 04/09/2018  Falls in the past year? 0 0 0  Number falls in past yr: 0 0 0  Injury with Fall? 0 0 0     Assessment & Plan  1. Uncontrolled hypertension  We will adjust the dose - candesartan (ATACAND) 32 MG tablet; Take 1 tablet (32 mg total) by mouth daily.  Dispense: 30 tablet; Refill: 0  2. 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 The patient was provided an opportunity to ask questions and all were answered. The patient agreed with the plan and demonstrated an understanding of the instructions.  The patient was advised to call back or seek an in-person evaluation if the symptoms worsen or if the condition fails to improve as anticipated.  I provided 15 minutes of non-face-to-face time during this encounter.

## 2018-09-18 DIAGNOSIS — Z905 Acquired absence of kidney: Secondary | ICD-10-CM | POA: Diagnosis not present

## 2018-09-18 DIAGNOSIS — Z7902 Long term (current) use of antithrombotics/antiplatelets: Secondary | ICD-10-CM | POA: Diagnosis not present

## 2018-09-18 DIAGNOSIS — Z09 Encounter for follow-up examination after completed treatment for conditions other than malignant neoplasm: Secondary | ICD-10-CM | POA: Diagnosis not present

## 2018-09-18 DIAGNOSIS — I714 Abdominal aortic aneurysm, without rupture: Secondary | ICD-10-CM | POA: Diagnosis not present

## 2018-09-18 DIAGNOSIS — I771 Stricture of artery: Secondary | ICD-10-CM | POA: Diagnosis not present

## 2018-09-18 DIAGNOSIS — Z95828 Presence of other vascular implants and grafts: Secondary | ICD-10-CM | POA: Diagnosis not present

## 2018-09-18 DIAGNOSIS — I4891 Unspecified atrial fibrillation: Secondary | ICD-10-CM | POA: Diagnosis not present

## 2018-09-18 DIAGNOSIS — Z7982 Long term (current) use of aspirin: Secondary | ICD-10-CM | POA: Diagnosis not present

## 2018-09-20 ENCOUNTER — Telehealth: Payer: Self-pay

## 2018-09-20 NOTE — Telephone Encounter (Signed)
Refill request for Hypertension medication:  Metoprolol 100 mg  Last office visit pertaining to hypertension: 04/072020  BP Readings from Last 3 Encounters:  09/02/18 (!) 157/97  07/17/18 (!) 168/110  07/15/18 (!) 147/88     Lab Results  Component Value Date   CREATININE 1.36 (H) 06/05/2018   BUN 20 06/05/2018   NA 142 06/05/2018   K 4.7 06/05/2018   CL 101 06/05/2018   CO2 30 06/05/2018    Follow-ups on file. None indicated

## 2018-09-24 MED ORDER — METOPROLOL SUCCINATE ER 50 MG PO TB24: 50.0000 mg | ORAL_TABLET | Freq: Two times a day (BID) | ORAL | 2 refills | Status: DC

## 2018-09-24 NOTE — Telephone Encounter (Signed)
Patient notified of change of dosage and his new prescription for Metoprolol was sent into [facility].

## 2018-09-24 NOTE — Telephone Encounter (Signed)
Pt has an appt for August and only has enough meds for today. Please advise

## 2018-10-23 ENCOUNTER — Other Ambulatory Visit: Payer: Self-pay | Admitting: Family Medicine

## 2018-10-23 DIAGNOSIS — I10 Essential (primary) hypertension: Secondary | ICD-10-CM

## 2018-12-24 ENCOUNTER — Ambulatory Visit (INDEPENDENT_AMBULATORY_CARE_PROVIDER_SITE_OTHER): Payer: Medicare Other | Admitting: Family Medicine

## 2018-12-24 ENCOUNTER — Encounter: Payer: Self-pay | Admitting: Family Medicine

## 2018-12-24 ENCOUNTER — Other Ambulatory Visit: Payer: Self-pay

## 2018-12-24 VITALS — BP 150/110 | HR 70 | Temp 97.5°F | Resp 16 | Ht 68.0 in | Wt 133.9 lb

## 2018-12-24 DIAGNOSIS — J410 Simple chronic bronchitis: Secondary | ICD-10-CM

## 2018-12-24 DIAGNOSIS — R739 Hyperglycemia, unspecified: Secondary | ICD-10-CM | POA: Diagnosis not present

## 2018-12-24 DIAGNOSIS — F5101 Primary insomnia: Secondary | ICD-10-CM

## 2018-12-24 DIAGNOSIS — H6123 Impacted cerumen, bilateral: Secondary | ICD-10-CM

## 2018-12-24 DIAGNOSIS — I480 Paroxysmal atrial fibrillation: Secondary | ICD-10-CM | POA: Diagnosis not present

## 2018-12-24 DIAGNOSIS — N183 Chronic kidney disease, stage 3 unspecified: Secondary | ICD-10-CM

## 2018-12-24 DIAGNOSIS — E441 Mild protein-calorie malnutrition: Secondary | ICD-10-CM

## 2018-12-24 DIAGNOSIS — I10 Essential (primary) hypertension: Secondary | ICD-10-CM | POA: Diagnosis not present

## 2018-12-24 DIAGNOSIS — I771 Stricture of artery: Secondary | ICD-10-CM

## 2018-12-24 DIAGNOSIS — K269 Duodenal ulcer, unspecified as acute or chronic, without hemorrhage or perforation: Secondary | ICD-10-CM

## 2018-12-24 DIAGNOSIS — I714 Abdominal aortic aneurysm, without rupture, unspecified: Secondary | ICD-10-CM

## 2018-12-24 DIAGNOSIS — G629 Polyneuropathy, unspecified: Secondary | ICD-10-CM | POA: Diagnosis not present

## 2018-12-24 DIAGNOSIS — F325 Major depressive disorder, single episode, in full remission: Secondary | ICD-10-CM | POA: Diagnosis not present

## 2018-12-24 DIAGNOSIS — Z23 Encounter for immunization: Secondary | ICD-10-CM

## 2018-12-24 DIAGNOSIS — K551 Chronic vascular disorders of intestine: Secondary | ICD-10-CM

## 2018-12-24 MED ORDER — METOPROLOL SUCCINATE ER 50 MG PO TB24: 50.0000 mg | ORAL_TABLET | Freq: Two times a day (BID) | ORAL | 1 refills | Status: DC

## 2018-12-24 MED ORDER — CANDESARTAN CILEXETIL 32 MG PO TABS: 32.0000 mg | ORAL_TABLET | Freq: Every day | ORAL | 1 refills | Status: DC

## 2018-12-24 MED ORDER — HYDROCHLOROTHIAZIDE 12.5 MG PO CAPS: 12.5000 mg | ORAL_CAPSULE | Freq: Every day | ORAL | 1 refills | Status: DC

## 2018-12-24 MED ORDER — ATORVASTATIN CALCIUM 10 MG PO TABS: 10.0000 mg | ORAL_TABLET | Freq: Every day | ORAL | 1 refills | Status: DC

## 2018-12-24 MED ORDER — TRAZODONE HCL 50 MG PO TABS: 50.0000 mg | ORAL_TABLET | Freq: Every day | ORAL | 0 refills | Status: DC

## 2018-12-24 MED ORDER — PANTOPRAZOLE SODIUM 40 MG PO TBEC: 40.0000 mg | DELAYED_RELEASE_TABLET | Freq: Every day | ORAL | 1 refills | Status: DC

## 2018-12-24 NOTE — Progress Notes (Signed)
Name: Bradley Ellis   MRN: [id number]    DOB: 11/01/1940   Date:12/24/2018       Progress Note  Subjective  Chief Complaint  Chief Complaint  Patient presents with  . ear lavage    3 month follow up  . Hypertension  . Weight Loss    HPI  HTN: bp has been elevated elevated in am's usually in the 150's, once 132/86, occasionally 160., bp is better in the evening. He has been taking metoprolol 50 mg BID and also Atacand 32  mg daily, he has not been taking hydralazine prn, he has noticed some ankle swelling intermittently, no previous history of gout, we will add hctz in am and take Atacand in pm. 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 He states he has been able to eat without abdominal pain since procedure. He was given Plavix for 3 months and after that to take aspirin, no problems since he stopped plavix.:   Malnutrition: doing better, he stopped PPI, taking aloe vera juice only , but explained to resume medication since he has a history of duodenal ulcer, he states able to eat since stent placed on SMA, but weight is stable  Insomnia: doing well on Trazodone half pill at night, sleeping better, he has nocturia 1-2 times per night  Chronic bronchitis: he never smoked, he still has some SOB with activity, no cough no wheezing   Patient Active Problem List   Diagnosis Date Noted  . 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 PUD (peptic ulcer disease) 06/20/2018  . 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 Mild protein-calorie malnutrition ([place]) 06/06/2018  . Primary insomnia 04/29/2018  . History of kidney cancer 04/29/2018  . 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 Chronic kidney disease, stage III  (moderate) ([place]) 04/29/2018  . Diastolic dysfunction 65/78/4696  . Mild mitral regurgitation 04/17/2018  . Tricuspid regurgitation 04/17/2018  . Duodenal ulcer without hemorrhage or perforation 04/09/2018  . Neuropathy 04/09/2018  . Paroxysmal A-fib ([place]) 04/03/2018  . Aneurysm of abdominal aorta (HCC) 04/03/2018  . Pain in both lower extremities 11/19/2014  . Major depression in remission ([place]) 11/18/2014  . Sleep disorder   . Hypertension   . GERD (gastroesophageal reflux disease)   . Chronic bronchitis ([place])   . Hyperlipidemia     Past Surgical History:  Procedure Laterality Date  . BREAST CYST EXCISION Bilateral    also in groin and by rectum at other times (7)  . CERVICAL DISCECTOMY  [id number]  . ELBOW ARTHROPLASTY Left 1962  . KNEE ARTHROSCOPY Left    2 arthroscopy. Another surgery due to infection (cactus needle)  . NEPHRECTOMY Right 1998   for cancer. Done at [facility]. No other Rx  . PROSTATE SURGERY     about 15 yrs ago  . SKIN CANCER EXCISION     several in various places  . TONSILLECTOMY AND ADENOIDECTOMY  1962    Family History  Problem Relation Age of Onset  . Cancer Mother        breast cancer  . Heart disease Sister   . Stroke Sister   . Diabetes Sister   . Diabetes Maternal Grandmother     Social History  Socioeconomic History  . Marital status: Married    Spouse name: Salena Saner  . Number of children: 2  . Years of education: Not on file  . Highest education level: Some college, no degree  Occupational History  . Occupation: [employer]  . Financial resource strain: Somewhat hard  . Food insecurity    Worry: Never true    Inability: Never true  . Transportation needs    Medical: No    Non-medical: No  Tobacco Use  . Smoking status: Never Smoker  . Smokeless tobacco: Never Used  Substance and Sexual Activity  . Alcohol use: No  . Drug use: No  . Sexual activity: Not on file  Lifestyle  . Physical activity     Days per week: 0 days    Minutes per session: 0 min  . Stress: Rather much  Relationships  . Social connections    Talks on phone: More than three times a week    Gets together: More than three times a week    Attends religious service: More than 4 times per year    Active member of club or organization: Yes    Attends meetings of clubs or organizations: More than 4 times per year    Relationship status: Married  . Intimate partner violence    Fear of current or ex partner: No    Emotionally abused: No    Physically abused: No    Forced sexual activity: No  Other Topics Concern  . 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  aspirin EC 81 MG tablet, Take 81 mg by mouth daily., Disp: , Rfl:  .  candesartan (ATACAND) 32 MG tablet, Take 1 tablet (32 mg total) by mouth daily. Every pm, Disp: 90 tablet, Rfl: 1 .  hydrALAZINE (APRESOLINE) 10 MG tablet, Take 1 tablet (10 mg total) by mouth 3 (three) times daily as needed. bp above 150/90, Disp: 90 tablet, Rfl: 0 .  metoprolol succinate (TOPROL-XL) 50 MG 24 hr tablet, Take 1 tablet (50 mg total) by mouth 2 (two) times daily., Disp: 180 tablet, Rfl: 1 .  pantoprazole (PROTONIX) 40 MG tablet, Take 1 tablet (40 mg total) by mouth daily., Disp: 90 tablet, Rfl: 1 .  traZODone (DESYREL) 50 MG tablet, Take 1 tablet (50 mg total) by mouth at bedtime., Disp: 90 tablet, Rfl: 0 .  atorvastatin (LIPITOR) 10 MG tablet, Take 1 tablet (10 mg total) by mouth daily., Disp: 90 tablet, Rfl: 1 .  hydrochlorothiazide (MICROZIDE) 12.5 MG capsule, Take 1 capsule (12.5 mg total) by mouth daily. In the morning, Disp: 90 capsule, Rfl: 1  Allergies  Allergen Reactions  . Sulfa Antibiotics Rash and Hives    I personally reviewed active problem list, medication list, allergies,  family history, social history with the patient/caregiver today.   ROS  Constitutional: Negative for fever or weight change.  Respiratory: Negative for cough positive for shortness of breath.   Cardiovascular: Negative for chest pain or palpitations.  Gastrointestinal: Negative for abdominal pain, no bowel changes.  Musculoskeletal: Negative for gait problem or joint swelling.  Skin: Negative for rash.  Neurological: Negative for dizziness or headache.  No other specific complaints in a complete review of systems (except as listed in HPI above).  Objective  Vitals:   12/24/18 1128  BP: (!) 150/110  Pulse: 70  Resp: 16  Temp: (!) 97.5 F (36.4 C)  TempSrc: Temporal  SpO2: 97%  Weight: 133 lb 14.4 oz (60.7 kg)  Height: 5\' 8"  (1.727 m)    Body mass index is 20.36 kg/m.  Physical Exam  Constitutional: Patient appears well-developed and very thin No distress.  HEENT: head atraumatic, normocephalic, pupils equal and reactive to light Cardiovascular: Normal rate, regular rhythm and normal heart sounds.  No murmur heard. No BLE edema. Pulmonary/Chest: Effort normal and breath sounds normal. No respiratory distress. Abdominal: Soft.  There is no tenderness  Psychiatric: Patient has a normal mood and affect. behavior is normal. Judgment and thought content normal.  PHQ2/9: Depression screen [facility] 2/9 12/24/2018 09/02/2018 07/17/2018 04/09/2018  Decreased Interest 0 0 1 0  Down, Depressed, Hopeless 0 0 1 0  PHQ - 2 Score 0 0 2 0  Altered sleeping 0 0 0 3  Tired, decreased energy 0 0 1 3  Change in appetite 0 0 0 3  Feeling bad or failure about yourself  0 0 0 0  Trouble concentrating 0 0 0 0  Moving slowly or fidgety/restless 0 0 0 0  Suicidal thoughts 0 0 0 0  PHQ-9 Score 0 0 3 9  Difficult doing work/chores - Not difficult at all Not difficult at all Not difficult at all    phq 9 is negative   Fall Risk: Fall Risk  12/24/2018 09/02/2018 04/29/2018 04/09/2018  Falls in the  past year? 0 0 0 0  Number falls in past yr: 0 0 0 0  Injury with Fall? 0 0 0 0    Functional Status Survey: Is the patient deaf or have difficulty hearing?: Yes Does the patient have difficulty seeing, even when wearing glasses/contacts?: No Does the patient have difficulty concentrating, remembering, or making decisions?: No Does the patient have difficulty walking or climbing stairs?: No Does the patient have difficulty dressing or bathing?: No Does the patient have difficulty doing errands alone such as visiting a doctor's office or shopping?: No    Assessment & Plan  1. SMA stenosis (HCC)  S/p stent placement , start statin therapy   2. Paroxysmal A-fib (HCC)  Rate controlled at this time  3. Simple chronic bronchitis ([place])   4. Mild protein-calorie malnutrition ([place])  Discussed ensure   5. Neuropathy  stable  6. Hypertension, benign  - candesartan (ATACAND) 32 MG tablet; Take 1 tablet (32 mg total) by mouth daily. Every pm  Dispense: 90 tablet; Refill: 1 - metoprolol succinate (TOPROL-XL) 50 MG 24 hr tablet; Take 1 tablet (50 mg total) by mouth 2 (two) times daily.  Dispense: 180 tablet; Refill: 1 - hydrochlorothiazide (MICROZIDE) 12.5 MG capsule; Take 1 capsule (12.5 mg total) by mouth daily. In the morning  Dispense: 90 capsule; Refill: 1  7. Abdominal aortic aneurysm (AAA) without rupture ([place])  On aspirin   8. Major depression in remission [facility])  Doing well   9. Chronic kidney disease, stage III (moderate) (HCC)  - COMPLETE METABOLIC PANEL WITH GFR  10. Hyperglycemia  - Hemoglobin A1c  11. Bilateral impacted cerumen  Verbal consent given Possible side effects discussed with patient Ears were  lavaged with warm water and peroxide  Patient tolerated procedure well No complications   12. Need for vaccination for pneumococcus  - Pneumococcal conjugate vaccine 13-valent IM  13. Need for Tdap vaccination  Refused   14. Duodenal  ulcer  without hemorrhage or perforation  - pantoprazole (PROTONIX) 40 MG tablet; Take 1 tablet (40 mg total) by mouth daily.  Dispense: 90 tablet; Refill: 1  15. Primary insomnia  - traZODone (DESYREL) 50 MG tablet; Take 1 tablet (50 mg total) by mouth at bedtime.  Dispense: 90 tablet; Refill: 0

## 2018-12-25 DIAGNOSIS — G629 Polyneuropathy, unspecified: Secondary | ICD-10-CM | POA: Diagnosis not present

## 2018-12-25 DIAGNOSIS — F325 Major depressive disorder, single episode, in full remission: Secondary | ICD-10-CM | POA: Diagnosis not present

## 2018-12-25 DIAGNOSIS — R739 Hyperglycemia, unspecified: Secondary | ICD-10-CM | POA: Diagnosis not present

## 2018-12-25 DIAGNOSIS — I771 Stricture of artery: Secondary | ICD-10-CM | POA: Diagnosis not present

## 2018-12-25 DIAGNOSIS — E441 Mild protein-calorie malnutrition: Secondary | ICD-10-CM | POA: Diagnosis not present

## 2018-12-25 DIAGNOSIS — I714 Abdominal aortic aneurysm, without rupture: Secondary | ICD-10-CM | POA: Diagnosis not present

## 2018-12-25 DIAGNOSIS — J410 Simple chronic bronchitis: Secondary | ICD-10-CM | POA: Diagnosis not present

## 2018-12-25 DIAGNOSIS — H6123 Impacted cerumen, bilateral: Secondary | ICD-10-CM | POA: Diagnosis not present

## 2018-12-25 DIAGNOSIS — I480 Paroxysmal atrial fibrillation: Secondary | ICD-10-CM | POA: Diagnosis not present

## 2018-12-25 DIAGNOSIS — Z23 Encounter for immunization: Secondary | ICD-10-CM | POA: Diagnosis not present

## 2018-12-25 DIAGNOSIS — I10 Essential (primary) hypertension: Secondary | ICD-10-CM | POA: Diagnosis not present

## 2018-12-25 DIAGNOSIS — N183 Chronic kidney disease, stage 3 (moderate): Secondary | ICD-10-CM | POA: Diagnosis not present

## 2018-12-25 LAB — COMPLETE METABOLIC PANEL WITH GFR
AG Ratio: 2.1 (calc) (ref 1.0–2.5)
ALT: 21 U/L (ref 9–46)
AST: 15 U/L (ref 10–35)
Albumin: 4.2 g/dL (ref 3.6–5.1)
Alkaline phosphatase (APISO): 57 U/L (ref 35–144)
BUN: 24 mg/dL (ref 7–25)
CO2: 30 mmol/L (ref 20–32)
Calcium: 9.5 mg/dL (ref 8.6–10.3)
Chloride: 105 mmol/L (ref 98–110)
Creat: 1.1 mg/dL (ref 0.70–1.11)
GFR, Est African American: 73 mL/min/{1.73_m2} (ref >=60)
GFR, Est Non African American: 63 mL/min/{1.73_m2} (ref >=60)
Globulin: 2 g/dL (calc) (ref 1.9–3.7)
Glucose, Bld: 81 mg/dL (ref 65–99)
Potassium: 4.5 mmol/L (ref 3.5–5.3)
Sodium: 142 mmol/L (ref 135–146)
Total Bilirubin: 0.6 mg/dL (ref 0.2–1.2)
Total Protein: 6.2 g/dL (ref 6.1–8.1)

## 2018-12-25 LAB — HEMOGLOBIN A1C
Hgb A1c MFr Bld: 5.5 % of total Hgb (ref <5.7)
Mean Plasma Glucose: 111 (calc)
eAG (mmol/L): 6.2 (calc)

## 2019-01-28 ENCOUNTER — Ambulatory Visit: Payer: Medicare Other

## 2019-07-14 ENCOUNTER — Other Ambulatory Visit: Payer: Self-pay

## 2019-07-14 DIAGNOSIS — F5101 Primary insomnia: Secondary | ICD-10-CM

## 2019-07-14 NOTE — Telephone Encounter (Signed)
Refill request for general medication. Trazodone to [facility]  Last office visit   No follow-ups on file.

## 2019-07-15 NOTE — Telephone Encounter (Signed)
Called both numbers on file and no answer and could not lvm. Pt need appt

## 2019-07-16 ENCOUNTER — Other Ambulatory Visit: Payer: Self-pay | Admitting: Family Medicine

## 2019-07-16 ENCOUNTER — Telehealth: Payer: Self-pay | Admitting: Family Medicine

## 2019-07-16 DIAGNOSIS — F5101 Primary insomnia: Secondary | ICD-10-CM

## 2019-07-16 MED ORDER — TRAZODONE HCL 50 MG PO TABS: 50.0000 mg | ORAL_TABLET | Freq: Every day | ORAL | 0 refills | Status: DC

## 2019-07-16 NOTE — Telephone Encounter (Signed)
Copied from [place] [id number]. Topic: General - Other >> Jul 16, 2019 10:36 AM Keene Breath wrote: Reason for CRM: Patient called to schedule appt. For his medication refill.  Has an appt. Scheduled for April.  Patient stated he needs some insomnia medication before then because he is not sleeping.  Patient would like it sent to his local pharmacy.  CB# [phone]

## 2019-07-24 ENCOUNTER — Emergency Department: Payer: Medicare Other

## 2019-07-24 ENCOUNTER — Encounter: Payer: Self-pay | Admitting: Emergency Medicine

## 2019-07-24 ENCOUNTER — Other Ambulatory Visit: Payer: Self-pay

## 2019-07-24 ENCOUNTER — Observation Stay
Admission: EM | Admit: 2019-07-24 | Discharge: 2019-07-25 | Disposition: A | Discharge status: Home / Self Care | Payer: Medicare Other | Attending: Internal Medicine | Admitting: Internal Medicine

## 2019-07-24 DIAGNOSIS — I700 Atherosclerosis of aorta: Secondary | ICD-10-CM | POA: Diagnosis not present

## 2019-07-24 DIAGNOSIS — E1142 Type 2 diabetes mellitus with diabetic polyneuropathy: Secondary | ICD-10-CM | POA: Insufficient documentation

## 2019-07-24 DIAGNOSIS — G479 Sleep disorder, unspecified: Secondary | ICD-10-CM | POA: Diagnosis not present

## 2019-07-24 DIAGNOSIS — Z7901 Long term (current) use of anticoagulants: Secondary | ICD-10-CM | POA: Insufficient documentation

## 2019-07-24 DIAGNOSIS — E1122 Type 2 diabetes mellitus with diabetic chronic kidney disease: Secondary | ICD-10-CM | POA: Insufficient documentation

## 2019-07-24 DIAGNOSIS — Z8249 Family history of ischemic heart disease and other diseases of the circulatory system: Secondary | ICD-10-CM | POA: Insufficient documentation

## 2019-07-24 DIAGNOSIS — Z8616 Personal history of COVID-19: Secondary | ICD-10-CM | POA: Insufficient documentation

## 2019-07-24 DIAGNOSIS — F329 Major depressive disorder, single episode, unspecified: Secondary | ICD-10-CM | POA: Diagnosis not present

## 2019-07-24 DIAGNOSIS — G8929 Other chronic pain: Secondary | ICD-10-CM | POA: Diagnosis not present

## 2019-07-24 DIAGNOSIS — E785 Hyperlipidemia, unspecified: Secondary | ICD-10-CM | POA: Diagnosis not present

## 2019-07-24 DIAGNOSIS — I714 Abdominal aortic aneurysm, without rupture: Secondary | ICD-10-CM | POA: Diagnosis not present

## 2019-07-24 DIAGNOSIS — Z85528 Personal history of other malignant neoplasm of kidney: Secondary | ICD-10-CM | POA: Insufficient documentation

## 2019-07-24 DIAGNOSIS — R0602 Shortness of breath: Principal | ICD-10-CM

## 2019-07-24 DIAGNOSIS — I081 Rheumatic disorders of both mitral and tricuspid valves: Secondary | ICD-10-CM | POA: Insufficient documentation

## 2019-07-24 DIAGNOSIS — R079 Chest pain, unspecified: Secondary | ICD-10-CM | POA: Diagnosis not present

## 2019-07-24 DIAGNOSIS — Z79899 Other long term (current) drug therapy: Secondary | ICD-10-CM | POA: Insufficient documentation

## 2019-07-24 DIAGNOSIS — Z7951 Long term (current) use of inhaled steroids: Secondary | ICD-10-CM | POA: Insufficient documentation

## 2019-07-24 DIAGNOSIS — M545 Low back pain, unspecified: Secondary | ICD-10-CM

## 2019-07-24 DIAGNOSIS — N183 Chronic kidney disease, stage 3 unspecified: Secondary | ICD-10-CM | POA: Insufficient documentation

## 2019-07-24 DIAGNOSIS — Z833 Family history of diabetes mellitus: Secondary | ICD-10-CM | POA: Insufficient documentation

## 2019-07-24 DIAGNOSIS — Z7952 Long term (current) use of systemic steroids: Secondary | ICD-10-CM | POA: Diagnosis not present

## 2019-07-24 DIAGNOSIS — E441 Mild protein-calorie malnutrition: Secondary | ICD-10-CM | POA: Insufficient documentation

## 2019-07-24 DIAGNOSIS — Z7982 Long term (current) use of aspirin: Secondary | ICD-10-CM | POA: Insufficient documentation

## 2019-07-24 DIAGNOSIS — J441 Chronic obstructive pulmonary disease with (acute) exacerbation: Secondary | ICD-10-CM | POA: Diagnosis not present

## 2019-07-24 DIAGNOSIS — Z882 Allergy status to sulfonamides status: Secondary | ICD-10-CM | POA: Insufficient documentation

## 2019-07-24 DIAGNOSIS — Z20822 Contact with and (suspected) exposure to covid-19: Secondary | ICD-10-CM | POA: Diagnosis not present

## 2019-07-24 DIAGNOSIS — K219 Gastro-esophageal reflux disease without esophagitis: Secondary | ICD-10-CM | POA: Diagnosis not present

## 2019-07-24 DIAGNOSIS — I129 Hypertensive chronic kidney disease with stage 1 through stage 4 chronic kidney disease, or unspecified chronic kidney disease: Secondary | ICD-10-CM | POA: Diagnosis not present

## 2019-07-24 DIAGNOSIS — I491 Atrial premature depolarization: Secondary | ICD-10-CM | POA: Diagnosis not present

## 2019-07-24 DIAGNOSIS — Z823 Family history of stroke: Secondary | ICD-10-CM | POA: Insufficient documentation

## 2019-07-24 DIAGNOSIS — B356 Tinea cruris: Secondary | ICD-10-CM

## 2019-07-24 DIAGNOSIS — Z905 Acquired absence of kidney: Secondary | ICD-10-CM | POA: Insufficient documentation

## 2019-07-24 DIAGNOSIS — I4510 Unspecified right bundle-branch block: Secondary | ICD-10-CM | POA: Diagnosis not present

## 2019-07-24 DIAGNOSIS — Z85828 Personal history of other malignant neoplasm of skin: Secondary | ICD-10-CM | POA: Insufficient documentation

## 2019-07-24 DIAGNOSIS — F5101 Primary insomnia: Secondary | ICD-10-CM | POA: Insufficient documentation

## 2019-07-24 DIAGNOSIS — I480 Paroxysmal atrial fibrillation: Secondary | ICD-10-CM | POA: Diagnosis not present

## 2019-07-24 DIAGNOSIS — Z803 Family history of malignant neoplasm of breast: Secondary | ICD-10-CM | POA: Insufficient documentation

## 2019-07-24 LAB — BASIC METABOLIC PANEL
Anion gap: 9 (ref 5–15)
BUN: 26 mg/dL — ABNORMAL HIGH (ref 8–23)
CO2: 26 mmol/L (ref 22–32)
Calcium: 8.7 mg/dL — ABNORMAL LOW (ref 8.9–10.3)
Chloride: 104 mmol/L (ref 98–111)
Creatinine, Ser: 1.26 mg/dL — ABNORMAL HIGH (ref 0.61–1.24)
GFR calc Af Amer: >60 mL/min (ref >60)
GFR calc non Af Amer: 54 mL/min — ABNORMAL LOW (ref >60)
Glucose, Bld: 150 mg/dL — ABNORMAL HIGH (ref 70–99)
Potassium: 4 mmol/L (ref 3.5–5.1)
Sodium: 139 mmol/L (ref 135–145)

## 2019-07-24 LAB — CBC
HCT: 41.8 % (ref 39.0–52.0)
Hemoglobin: 13.7 g/dL (ref 13.0–17.0)
MCH: 30.4 pg (ref 26.0–34.0)
MCHC: 32.8 g/dL (ref 30.0–36.0)
MCV: 92.9 fL (ref 80.0–100.0)
Platelets: 224 10*3/uL (ref 150–400)
RBC: 4.5 MIL/uL (ref 4.22–5.81)
RDW: 14.1 % (ref 11.5–15.5)
WBC: 5.3 10*3/uL (ref 4.0–10.5)
nRBC: 0 % (ref 0.0–0.2)

## 2019-07-24 LAB — TROPONIN I (HIGH SENSITIVITY)
Troponin I (High Sensitivity): 5 ng/L (ref <18)
Troponin I (High Sensitivity): 5 ng/L (ref <18)

## 2019-07-24 LAB — FIBRIN DERIVATIVES D-DIMER (ARMC ONLY): Fibrin derivatives D-dimer (ARMC): 897.31 ng/mL (FEU) — ABNORMAL HIGH (ref 0.00–499.00)

## 2019-07-24 LAB — BRAIN NATRIURETIC PEPTIDE: B Natriuretic Peptide: 39 pg/mL (ref 0.0–100.0)

## 2019-07-24 MED ORDER — ACETAMINOPHEN 500 MG PO TABS: 1000.0000 mg | ORAL_TABLET | Freq: Once | ORAL | Status: DC

## 2019-07-24 MED ORDER — IRBESARTAN-HYDROCHLOROTHIAZIDE 300-12.5 MG PO TABS: 1.0000 | ORAL_TABLET | Freq: Every day | ORAL | Status: DC

## 2019-07-24 MED ORDER — BUDESONIDE 0.5 MG/2ML IN SUSP
2.0000 mg | Freq: Four times a day (QID) | RESPIRATORY_TRACT | Status: DC
  Administered 2019-07-24 – 2019-07-25 (×2): 2 mg via RESPIRATORY_TRACT
  Filled 2019-07-24 (×5): qty 8

## 2019-07-24 MED ORDER — SODIUM CHLORIDE 0.9 % IV SOLN
500.0000 mg | INTRAVENOUS | Status: AC
  Administered 2019-07-24: 500 mg via INTRAVENOUS
  Filled 2019-07-24: qty 500

## 2019-07-24 MED ORDER — TRAZODONE HCL 50 MG PO TABS
50.0000 mg | ORAL_TABLET | Freq: Every evening | ORAL | Status: DC | PRN
  Administered 2019-07-25: 50 mg via ORAL
  Filled 2019-07-24: qty 1

## 2019-07-24 MED ORDER — ASPIRIN EC 81 MG PO TBEC
81.0000 mg | DELAYED_RELEASE_TABLET | Freq: Every day | ORAL | Status: DC
  Administered 2019-07-25: 81 mg via ORAL
  Filled 2019-07-24: qty 1

## 2019-07-24 MED ORDER — PREDNISONE 20 MG PO TABS: 40.0000 mg | ORAL_TABLET | Freq: Every day | ORAL | Status: DC

## 2019-07-24 MED ORDER — OXYCODONE-ACETAMINOPHEN 5-325 MG PO TABS
1.0000 | ORAL_TABLET | Freq: Four times a day (QID) | ORAL | Status: DC | PRN
  Administered 2019-07-24 – 2019-07-25 (×2): 1 via ORAL
  Filled 2019-07-24 (×2): qty 1

## 2019-07-24 MED ORDER — IPRATROPIUM-ALBUTEROL 0.5-2.5 (3) MG/3ML IN SOLN
3.0000 mL | Freq: Once | RESPIRATORY_TRACT | Status: AC
  Administered 2019-07-24: 3 mL via RESPIRATORY_TRACT
  Filled 2019-07-24: qty 3

## 2019-07-24 MED ORDER — IRBESARTAN 150 MG PO TABS
300.0000 mg | ORAL_TABLET | Freq: Every day | ORAL | Status: DC
  Administered 2019-07-24 – 2019-07-25 (×2): 300 mg via ORAL
  Filled 2019-07-24 (×3): qty 2

## 2019-07-24 MED ORDER — IOHEXOL 350 MG/ML SOLN
75.0000 mL | Freq: Once | INTRAVENOUS | Status: AC | PRN
  Administered 2019-07-24: 75 mL via INTRAVENOUS

## 2019-07-24 MED ORDER — HYDROCHLOROTHIAZIDE 12.5 MG PO CAPS
12.5000 mg | ORAL_CAPSULE | Freq: Every day | ORAL | Status: DC
  Administered 2019-07-24 – 2019-07-25 (×2): 12.5 mg via ORAL
  Filled 2019-07-24 (×2): qty 1

## 2019-07-24 MED ORDER — ENOXAPARIN SODIUM 40 MG/0.4ML ~~LOC~~ SOLN
40.0000 mg | SUBCUTANEOUS | Status: DC
  Administered 2019-07-24: 40 mg via SUBCUTANEOUS
  Filled 2019-07-24: qty 0.4

## 2019-07-24 MED ORDER — AZITHROMYCIN 500 MG PO TABS: 500.0000 mg | ORAL_TABLET | Freq: Every day | ORAL | Status: DC

## 2019-07-24 MED ORDER — METHYLPREDNISOLONE SODIUM SUCC 125 MG IJ SOLR
125.0000 mg | Freq: Once | INTRAMUSCULAR | Status: AC
  Administered 2019-07-24: 125 mg via INTRAVENOUS
  Filled 2019-07-24: qty 2

## 2019-07-24 MED ORDER — IPRATROPIUM-ALBUTEROL 0.5-2.5 (3) MG/3ML IN SOLN
3.0000 mL | Freq: Four times a day (QID) | RESPIRATORY_TRACT | Status: DC
  Administered 2019-07-24 – 2019-07-25 (×3): 3 mL via RESPIRATORY_TRACT
  Filled 2019-07-24 (×3): qty 3

## 2019-07-24 MED ORDER — METHYLPREDNISOLONE SODIUM SUCC 125 MG IJ SOLR
60.0000 mg | Freq: Four times a day (QID) | INTRAMUSCULAR | Status: DC
  Administered 2019-07-24 – 2019-07-25 (×2): 60 mg via INTRAVENOUS
  Filled 2019-07-24 (×2): qty 2

## 2019-07-24 MED ORDER — HYDROCODONE-ACETAMINOPHEN 5-325 MG PO TABS
1.0000 | ORAL_TABLET | Freq: Once | ORAL | Status: AC
  Administered 2019-07-24: 1 via ORAL
  Filled 2019-07-24: qty 1

## 2019-07-24 MED ORDER — ALBUTEROL SULFATE (2.5 MG/3ML) 0.083% IN NEBU: 2.5000 mg | INHALATION_SOLUTION | RESPIRATORY_TRACT | Status: DC | PRN

## 2019-07-24 NOTE — H&P (Addendum)
[facility] NAME: Bradley Ellis    MR#:  [id number]  DATE OF BIRTH:  1940/05/20  DATE OF ADMISSION:  07/24/2019  PRIMARY CARE PHYSICIAN: Steele Sizer, MD   REQUESTING/REFERRING PHYSICIAN: Duffy Bruce, MD  CHIEF COMPLAINT:   Chief Complaint  Patient presents with  . Shortness of Breath  . Chest Pain    HISTORY OF PRESENT ILLNESS:  Bradley Ellis  is a 79 y.o. male with a known history of COPD, recurrent pneumonia, hypertension, hyperlipidemia is being admitted for shortness of breath.  The patient states that over the last 24 hours, he has had progressively worsening cough with shortness of breath.  It started yesterday throughout the day, which she thought was due to exposure to the cold weather and rain.  He was having intercourse with his wife yesterday, when he began to feel more short of breath.  This is persisted since then. He normally walks up to 1 mile daily, and has not noticed any chest pain or shortness of breath with this.  He does note that he returned from a mission trip in [place] several weeks ago, but says he felt fine at the time.  He did take a sildenafil prior to intercourse, but has taken this before. He would like to eat. His wife is at bedside. PAST MEDICAL HISTORY:   Past Medical History:  Diagnosis Date  . Chronic bronchitis (HCC)    not from smoking  . 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 GERD (gastroesophageal reflux disease)   . Hyperlipidemia   . Hypertension   . Laryngeal nodule    benign  . Sleep disorder    PAST SURGICAL HISTORY:   Past Surgical History:  Procedure Laterality Date  . BREAST CYST EXCISION Bilateral    also in groin and by rectum at other times (7)  . CERVICAL DISCECTOMY  [id number]  . ELBOW ARTHROPLASTY Left 1962  . KNEE ARTHROSCOPY Left    2 arthroscopy. Another surgery due to infection (cactus needle)  . NEPHRECTOMY Right 1998   for cancer. Done at [facility], [place]. No  other Rx  . PROSTATE SURGERY     about 15 yrs ago  . SKIN CANCER EXCISION     several in various places  . TONSILLECTOMY AND ADENOIDECTOMY  1962   SOCIAL HISTORY:   Social History   Tobacco Use  . Smoking status: Never Smoker  . Smokeless tobacco: Never Used  Substance Use Topics  . Alcohol use: No   FAMILY HISTORY:   Family History  Problem Relation Age of Onset  . Cancer Mother        breast cancer  . Heart disease Sister   . Stroke Sister   . Diabetes Sister   . Diabetes Maternal Grandmother    DRUG ALLERGIES:   Allergies  Allergen Reactions  . Sulfa Antibiotics Rash and Hives   REVIEW OF SYSTEMS:  Review of Systems  Constitutional: Negative for diaphoresis, fever, malaise/fatigue and weight loss.  HENT: Negative for ear discharge, ear pain, hearing loss, nosebleeds, sore throat and tinnitus.   Eyes: Negative for blurred vision and pain.  Respiratory: Positive for shortness of breath. Negative for cough, hemoptysis and wheezing.   Cardiovascular: Negative for chest pain, palpitations, orthopnea and leg swelling.  Gastrointestinal: Negative for abdominal pain, blood in stool, constipation, diarrhea, heartburn, nausea and vomiting.  Genitourinary: Negative for dysuria, frequency and urgency.  Musculoskeletal: Negative for back pain and myalgias.  Skin: Negative for itching and rash.  Neurological: Negative for dizziness, tingling, tremors, focal weakness, seizures, weakness and headaches.  Psychiatric/Behavioral: Negative for depression. The patient is not nervous/anxious.    MEDICATIONS AT HOME:   Prior to Admission medications   Medication Sig Start Date End Date Taking? Authorizing Provider  aspirin EC 81 MG tablet Take 81 mg by mouth daily.   Yes [provider]  carboxymethylcellulose (REFRESH PLUS) 0.5 % SOLN Place 1 drop into both eyes 3 (three) times daily as needed.   Yes [provider]  furosemide (LASIX) 40 MG tablet Take 40 mg by  mouth as needed for fluid.   Yes [provider]  hydrALAZINE (APRESOLINE) 10 MG tablet Take 1 tablet (10 mg total) by mouth 3 (three) times daily as needed. bp above 150/90 06/06/18  Yes Sowles, Drue Stager, MD  irbesartan-hydrochlorothiazide (AVALIDE) 300-12.5 MG tablet Take 1 tablet by mouth daily.   Yes [provider]  loperamide (IMODIUM A-D) 2 MG tablet Take 2 mg by mouth 4 (four) times daily as needed for diarrhea or loose stools.   Yes [provider]  loratadine (CLARITIN) 10 MG tablet Take 10 mg by mouth daily.   Yes [provider]  Niacin (VITAMIN B-3 PO) Take 1 tablet by mouth daily. VITAL FUERTE H3 100MG  CAPSULE   Yes [provider]  sildenafil (VIAGRA) 50 MG tablet Take 50 mg by mouth daily as needed for erectile dysfunction.   Yes [provider]  Yohimbe Bark 500 MG CAPS Take 1 capsule by mouth 2 (two) times daily.   Yes [provider]  atorvastatin (LIPITOR) 10 MG tablet Take 1 tablet (10 mg total) by mouth daily. Patient not taking: Reported on 07/24/2019 12/24/18   Steele Sizer, MD  candesartan (ATACAND) 32 MG tablet Take 1 tablet (32 mg total) by mouth daily. Every pm Patient not taking: Reported on 07/24/2019 12/24/18   Steele Sizer, MD  hydrochlorothiazide (MICROZIDE) 12.5 MG capsule Take 1 capsule (12.5 mg total) by mouth daily. In the morning Patient not taking: Reported on 07/24/2019 12/24/18   Steele Sizer, MD  metoprolol succinate (TOPROL-XL) 50 MG 24 hr tablet Take 1 tablet (50 mg total) by mouth 2 (two) times daily. Patient not taking: Reported on 07/24/2019 12/24/18   Steele Sizer, MD  pantoprazole (PROTONIX) 40 MG tablet Take 1 tablet (40 mg total) by mouth daily. Patient not taking: Reported on 07/24/2019 12/24/18   Steele Sizer, MD  traZODone (DESYREL) 50 MG tablet Take 1 tablet (50 mg total) by mouth at bedtime. Patient not taking: Reported on 07/24/2019 07/16/19   Steele Sizer, MD    VITAL  SIGNS:  Blood pressure (!) 143/83, pulse 86, temperature 98.8 F (37.1 C), temperature source Oral, resp. rate 14, height 5\' 8"  (1.727 m), weight 63.5 kg, SpO2 100 %. PHYSICAL EXAMINATION:  Physical Exam  GENERAL:  79 y.o.-year-old patient lying in the bed with no acute distress.  EYES: Pupils equal, round, reactive to light and accommodation. No scleral icterus. Extraocular muscles intact.  HEENT: Head atraumatic, normocephalic. Oropharynx and nasopharynx clear.  NECK:  Supple, no jugular venous distention. No thyroid enlargement, no tenderness.  LUNGS: Normal breath sounds bilaterally, no rales,rhonchi or crepitation. Wheezing + in LL, No use of accessory muscles of respiration.  CARDIOVASCULAR: S1, S2 normal. No murmurs, rubs, or gallops.  ABDOMEN: Soft, nontender, nondistended. Bowel sounds present. No organomegaly or mass.  EXTREMITIES: No pedal edema, cyanosis, or clubbing.  NEUROLOGIC: Cranial nerves II through  XII are intact. Muscle strength 5/5 in all extremities. Sensation intact. Gait not checked.  PSYCHIATRIC: The patient is alert and oriented x 3.  SKIN: No obvious rash, lesion, or ulcer.  LABORATORY PANEL:   CBC Recent Labs  Lab 07/24/19 1004  WBC 5.3  HGB 13.7  HCT 41.8  PLT 224   ------------------------------------------------------------------------------------------------------------------  Chemistries  Recent Labs  Lab 07/24/19 1004  NA 139  K 4.0  CL 104  CO2 26  GLUCOSE 150*  BUN 26*  CREATININE 1.26*  CALCIUM 8.7*   ------------------------------------------------------------------------------------------------------------------  Cardiac Enzymes No results for input(s): TROPONINI in the last 168 hours. ------------------------------------------------------------------------------------------------------------------  RADIOLOGY:  DG Chest 2 View  Result Date: 07/24/2019 CLINICAL DATA:  Shortness of breath, chest pain EXAM: CHEST - 2 VIEW  COMPARISON:  06/05/2018 FINDINGS: Again noted is the right mediastinal mass. Heart is borderline in size. Hyperinflation of the lungs. Bibasilar scarring. No acute confluent opacities or effusions. No acute bony abnormality. IMPRESSION: Right anterior mediastinal mass again noted. Recommend further evaluation with chest CT with IV contrast. Hyperinflation, bibasilar scarring. Electronically Signed   By: Rolm Baptise M.D.   On: 07/24/2019 11:15   CT Angio Chest PE W and/or Wo Contrast  Result Date: 07/24/2019 CLINICAL DATA:  Shortness of breath. Diagnosed with COVID 2 months ago. Chest pressure. EXAM: CT ANGIOGRAPHY CHEST WITH CONTRAST TECHNIQUE: Multidetector CT imaging of the chest was performed using the standard protocol during bolus administration of intravenous contrast. Multiplanar CT image reconstructions and MIPs were obtained to evaluate the vascular anatomy. CONTRAST:  60mL OMNIPAQUE IOHEXOL 350 MG/ML SOLN COMPARISON:  Chest radiographs including earlier today. Report from chest CT 06/19/2018 at an outside institution FINDINGS: Cardiovascular: There are no filling defects within the pulmonary arteries to suggest pulmonary embolus. Aortic atherosclerosis and tortuosity. No aortic dissection. Borderline cardiomegaly. No pericardial effusion. Mediastinum/Nodes: Cystic right anterior mediastinal mass measures 6.4 x 3.7 x 10.5 cm, this is unchanged in size from that described on prior exam. Measures simple fluid density without internal heterogeneity or obvious enhancement. No soft tissue components. Findings most consistent with pericardial cyst. Small mediastinal and hilar lymph nodes, not enlarged by size criteria. Small calcified left hilar nodes consistent with prior granulomatous disease. No esophageal wall thickening. No suspicious thyroid nodule. Lungs/Pleura: Compressive atelectasis in the right middle lobe adjacent to pericardial cyst. There are mild hypoventilatory changes in the dependent lungs  with scattered subsegmental atelectasis. Calcified granuloma in the left lower lobe. Tiny right apical pulmonary nodule, series 6, image 18. This is described on prior exam. Tiny subpleural right upper lobe nodule series 6, image 29. No confluent consolidation. No ground-glass opacities typical of COVID-19. Trachea and mainstem bronchi are patent. Upper Abdomen: Calcified splenic granuloma. Surgical clips in the right upper quadrant abutting the right lobe of the liver. No acute findings. Musculoskeletal: There are no acute or suspicious osseous abnormalities. Age related degenerative change in the thoracic spine. Review of the MIP images confirms the above findings. IMPRESSION: 1. No pulmonary embolus. 2. Mild hypoventilatory changes in the lung bases with subsegmental atelectasis. No other acute intrathoracic abnormality. 3. Cystic right anterior mediastinal mass measuring 6.4 x 3.7 x 10.5 cm is most consistent with pericardial cyst. This is unchanged in size compared from that described on outside CT 06/19/2018, those images are not available for direct comparison. 4. Sequela of prior granulomatous disease. Tiny right upper lobe pulmonary nodule, also described on prior. No follow-up needed if patient is low-risk (and has no known or suspected  primary neoplasm). Non-contrast chest CT can be considered in 12 months if patient is high-risk. This recommendation follows the consensus statement: Guidelines for Management of Incidental Pulmonary Nodules Detected on CT Images: From the Fleischner Society 2017; Radiology 2017; 284:228-243. Aortic Atherosclerosis (ICD10-I70.0). Electronically Signed   By: Keith Rake M.D.   On: 07/24/2019 13:02   IMPRESSION AND PLAN:  7 Y m with k/h/o COPD, recurrent pneumonia, hypertension, hyperlipidemia being admitted for COPD exacerbation  * COPD exacerbation - CT chest neg for PE. Stable pericardial cyst - will start steroids, nebs and monitor. - Not much hypoxic but  has wheezing so will add steroids - initial troponins neg.  - he seems somewhat anxious, monitor for now  * HTN - continue home meds  All the records are reviewed and case discussed with ED provider. Management plans discussed with the patient, family and they are in agreement.  CODE STATUS: FULL CODE  TOTAL TIME TAKING CARE OF THIS PATIENT:35 minutes.    Max Sane M.D on 07/24/2019 at 2:18 PM  [facility]   CC: Primary care physician; Steele Sizer, MD   Note: This dictation was prepared with Dragon dictation along with smaller phrase technology. Any transcriptional errors that result from this process are unintentional.

## 2019-07-24 NOTE — ED Provider Notes (Addendum)
[facility] Emergency Department Provider Note  ____________________________________________   First MD Initiated Contact with Patient 07/24/19 1006     (approximate)  I have reviewed the triage vital signs and the nursing notes.   HISTORY  Chief Complaint Shortness of Breath and Chest Pain    HPI Bradley Ellis is a 79 y.o. male with past medical history of COPD, recurrent pneumonia, hypertension, hyperlipidemia, here with shortness of breath.  The patient states that over the last 24 hours, he has had progressively worsening cough with shortness of breath.  It started yesterday throughout the day, which she thought was due to exposure to the cold weather and rain.  He was having intercourse with his wife yesterday, when he began to feel more short of breath.  This is persisted since then.  He denies any associated chest pain, along with shortness of breath which is present with both exertion and at rest.  He normally walks up to 1 mile daily, and has not noticed any chest pain or shortness of breath with this.  He does note that he returned from a mission trip in [place] several weeks ago, but says he felt fine at the time.  He did take a sildenafil prior to intercourse, but has taken this before.  No lower extremity swelling.  No other acute complaints.  No known fevers or chills.        Past Medical History:  Diagnosis Date  . Chronic bronchitis (HCC)    not from smoking  . 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 GERD (gastroesophageal reflux disease)   . Hyperlipidemia   . Hypertension   . Laryngeal nodule    benign  . Sleep disorder     Patient Active Problem List   Diagnosis Date Noted  . COPD exacerbation ([place]) 07/24/2019  . 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 PUD (peptic ulcer disease) 06/20/2018  . 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 Mild protein-calorie malnutrition ([place]) 06/06/2018  . Primary insomnia 04/29/2018  . History of kidney  cancer 04/29/2018  . 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 Chronic kidney disease, stage III (moderate) 04/29/2018  . Diastolic dysfunction [id number]  . Mild mitral regurgitation 04/17/2018  . Tricuspid regurgitation 04/17/2018  . Duodenal ulcer without hemorrhage or perforation 04/09/2018  . Neuropathy 04/09/2018  . Paroxysmal A-fib ([place]) 04/03/2018  . Aneurysm of abdominal aorta (HCC) 04/03/2018  . Pain in both lower extremities 11/19/2014  . Major depression in remission ([place]) 11/18/2014  . Sleep disorder   . Hypertension   . GERD (gastroesophageal reflux disease)   . Chronic bronchitis ([place])   . Hyperlipidemia     Past Surgical History:  Procedure Laterality Date  . BREAST CYST EXCISION Bilateral    also in groin and by rectum at other times (7)  . CERVICAL DISCECTOMY  [id number]  . ELBOW ARTHROPLASTY Left 1962  . KNEE ARTHROSCOPY Left    2 arthroscopy. Another surgery due to infection (cactus needle)  . NEPHRECTOMY Right 1998   for cancer. Done at [facility]. No other Rx  . PROSTATE SURGERY     about 15 yrs ago  . SKIN CANCER EXCISION     several in various places  . TONSILLECTOMY AND ADENOIDECTOMY  1962    Prior to Admission medications   Medication Sig Start Date End Date Taking? Authorizing Provider  aspirin EC 81 MG tablet Take 81 mg by mouth daily.   Yes [provider]  carboxymethylcellulose (REFRESH  PLUS) 0.5 % SOLN Place 1 drop into both eyes 3 (three) times daily as needed.   Yes [provider]  furosemide (LASIX) 40 MG tablet Take 40 mg by mouth as needed for fluid.   Yes [provider]  hydrALAZINE (APRESOLINE) 10 MG tablet Take 1 tablet (10 mg total) by mouth 3 (three) times daily as needed. bp above 150/90 06/06/18  Yes Sowles, Drue Stager, MD  irbesartan-hydrochlorothiazide (AVALIDE) 300-12.5 MG tablet Take 1 tablet by mouth daily.   Yes [provider]  loperamide (IMODIUM A-D) 2 MG tablet Take 2 mg by mouth 4  (four) times daily as needed for diarrhea or loose stools.   Yes [provider]  loratadine (CLARITIN) 10 MG tablet Take 10 mg by mouth daily.   Yes [provider]  Niacin (VITAMIN B-3 PO) Take 1 tablet by mouth daily. VITAL FUERTE H3 100MG  CAPSULE   Yes [provider]  sildenafil (VIAGRA) 50 MG tablet Take 50 mg by mouth daily as needed for erectile dysfunction.   Yes [provider]  Yohimbe Bark 500 MG CAPS Take 1 capsule by mouth 2 (two) times daily.   Yes [provider]  atorvastatin (LIPITOR) 10 MG tablet Take 1 tablet (10 mg total) by mouth daily. Patient not taking: Reported on 07/24/2019 12/24/18   Steele Sizer, MD  candesartan (ATACAND) 32 MG tablet Take 1 tablet (32 mg total) by mouth daily. Every pm Patient not taking: Reported on 07/24/2019 12/24/18   Steele Sizer, MD  hydrochlorothiazide (MICROZIDE) 12.5 MG capsule Take 1 capsule (12.5 mg total) by mouth daily. In the morning Patient not taking: Reported on 07/24/2019 12/24/18   Steele Sizer, MD  metoprolol succinate (TOPROL-XL) 50 MG 24 hr tablet Take 1 tablet (50 mg total) by mouth 2 (two) times daily. Patient not taking: Reported on 07/24/2019 12/24/18   Steele Sizer, MD  pantoprazole (PROTONIX) 40 MG tablet Take 1 tablet (40 mg total) by mouth daily. Patient not taking: Reported on 07/24/2019 12/24/18   Steele Sizer, MD  traZODone (DESYREL) 50 MG tablet Take 1 tablet (50 mg total) by mouth at bedtime. Patient not taking: Reported on 07/24/2019 07/16/19   Steele Sizer, MD    Allergies Sulfa antibiotics  Family History  Problem Relation Age of Onset  . Cancer Mother        breast cancer  . Heart disease Sister   . Stroke Sister   . Diabetes Sister   . Diabetes Maternal Grandmother     Social History Social History   Tobacco Use  . Smoking status: Never Smoker  . Smokeless tobacco: Never Used  Substance Use Topics  . Alcohol use: No  . Drug use: No     Review of Systems  Review of Systems  Constitutional: Positive for fatigue. Negative for chills and fever.  HENT: Negative for sore throat.   Respiratory: Positive for cough, chest tightness, shortness of breath and wheezing.   Cardiovascular: Negative for chest pain.  Gastrointestinal: Negative for abdominal pain.  Genitourinary: Negative for flank pain.  Musculoskeletal: Negative for neck pain.  Skin: Negative for rash and wound.  Allergic/Immunologic: Negative for immunocompromised state.  Neurological: Negative for weakness and numbness.  Hematological: Does not bruise/bleed easily.  All other systems reviewed and are negative.    ____________________________________________  PHYSICAL EXAM:      VITAL SIGNS: ED Triage Vitals  Enc Vitals Group     BP 07/24/19 1007 (!) 143/83     Pulse Rate  07/24/19 1007 99     Resp 07/24/19 1007 20     Temp 07/24/19 1029 98.8 F (37.1 C)     Temp Source 07/24/19 1029 Oral     SpO2 07/24/19 1007 94 %     Weight 07/24/19 0956 140 lb (63.5 kg)     Height 07/24/19 0956 5\' 8"  (1.727 m)     Head Circumference --      Peak Flow --      Pain Score 07/24/19 0956 5     Pain Loc --      Pain Edu? --      Excl. in [place]? --      Physical Exam Vitals and nursing note reviewed.  Constitutional:      General: He is not in acute distress.    Appearance: He is well-developed.  HENT:     Head: Normocephalic and atraumatic.  Eyes:     Conjunctiva/sclera: Conjunctivae normal.  Cardiovascular:     Rate and Rhythm: Normal rate and regular rhythm.     Heart sounds: Normal heart sounds.  Pulmonary:     Effort: Pulmonary effort is normal. No respiratory distress.     Breath sounds: Examination of the right-middle field reveals wheezing. Examination of the left-middle field reveals wheezing. Examination of the right-lower field reveals wheezing and rales. Examination of the left-lower field reveals wheezing and rales. Wheezing and rales present.   Abdominal:     General: There is no distension.  Musculoskeletal:     Cervical back: Neck supple.  Skin:    General: Skin is warm.     Capillary Refill: Capillary refill takes less than 2 seconds.     Findings: No rash.  Neurological:     Mental Status: He is alert and oriented to person, place, and time.     Motor: No abnormal muscle tone.       ____________________________________________   LABS (all labs ordered are listed, but only abnormal results are displayed)  Labs Reviewed  BASIC METABOLIC PANEL - Abnormal; Notable for the following components:      Result Value   Glucose, Bld 150 (*)    BUN 26 (*)    Creatinine, Ser 1.26 (*)    Calcium 8.7 (*)    GFR calc non Af Amer 54 (*)    All other components within normal limits  FIBRIN DERIVATIVES D-DIMER ([facility] ONLY) - Abnormal; Notable for the following components:   Fibrin derivatives D-dimer ([facility]) 897.31 (*)    All other components within normal limits  EXPECTORATED SPUTUM ASSESSMENT W REFEX TO RESP CULTURE  CBC  BRAIN NATRIURETIC PEPTIDE  HIV ANTIBODY (ROUTINE TESTING W REFLEX)  CREATININE, SERUM  TROPONIN I (HIGH SENSITIVITY)  TROPONIN I (HIGH SENSITIVITY)    ____________________________________________  EKG: Normal sinus rhythm, ventricular rate 94.  QRS 120, QTc 461.  Right bundle branch block with left anterior fascicular block.  Read as ST elevation MI, but I see no reciprocal's and patient has some moderate PR depression and inferior leads which is likely contributing to this. ________________________________________  RADIOLOGY All imaging, including plain films, CT scans, and ultrasounds, independently reviewed by me, and interpretations confirmed via formal radiology reads.  ED MD interpretation:   Chest x-ray: Clear, persistent mediastinal mass CT chest: Hypoventilatory changes with subsegmental atelectasis, no PE, stable mediastinal mass, sequela of prior granulomatous disease  Official radiology  report(s): DG Chest 2 View  Result Date: 07/24/2019 CLINICAL DATA:  Shortness of breath, chest pain EXAM: CHEST -  2 VIEW COMPARISON:  06/05/2018 FINDINGS: Again noted is the right mediastinal mass. Heart is borderline in size. Hyperinflation of the lungs. Bibasilar scarring. No acute confluent opacities or effusions. No acute bony abnormality. IMPRESSION: Right anterior mediastinal mass again noted. Recommend further evaluation with chest CT with IV contrast. Hyperinflation, bibasilar scarring. Electronically Signed   By: Rolm Baptise M.D.   On: 07/24/2019 11:15   CT Angio Chest PE W and/or Wo Contrast  Result Date: 07/24/2019 CLINICAL DATA:  Shortness of breath. Diagnosed with COVID 2 months ago. Chest pressure. EXAM: CT ANGIOGRAPHY CHEST WITH CONTRAST TECHNIQUE: Multidetector CT imaging of the chest was performed using the standard protocol during bolus administration of intravenous contrast. Multiplanar CT image reconstructions and MIPs were obtained to evaluate the vascular anatomy. CONTRAST:  46mL OMNIPAQUE IOHEXOL 350 MG/ML SOLN COMPARISON:  Chest radiographs including earlier today. Report from chest CT 06/19/2018 at an outside institution FINDINGS: Cardiovascular: There are no filling defects within the pulmonary arteries to suggest pulmonary embolus. Aortic atherosclerosis and tortuosity. No aortic dissection. Borderline cardiomegaly. No pericardial effusion. Mediastinum/Nodes: Cystic right anterior mediastinal mass measures 6.4 x 3.7 x 10.5 cm, this is unchanged in size from that described on prior exam. Measures simple fluid density without internal heterogeneity or obvious enhancement. No soft tissue components. Findings most consistent with pericardial cyst. Small mediastinal and hilar lymph nodes, not enlarged by size criteria. Small calcified left hilar nodes consistent with prior granulomatous disease. No esophageal wall thickening. No suspicious thyroid nodule. Lungs/Pleura: Compressive  atelectasis in the right middle lobe adjacent to pericardial cyst. There are mild hypoventilatory changes in the dependent lungs with scattered subsegmental atelectasis. Calcified granuloma in the left lower lobe. Tiny right apical pulmonary nodule, series 6, image 18. This is described on prior exam. Tiny subpleural right upper lobe nodule series 6, image 29. No confluent consolidation. No ground-glass opacities typical of COVID-19. Trachea and mainstem bronchi are patent. Upper Abdomen: Calcified splenic granuloma. Surgical clips in the right upper quadrant abutting the right lobe of the liver. No acute findings. Musculoskeletal: There are no acute or suspicious osseous abnormalities. Age related degenerative change in the thoracic spine. Review of the MIP images confirms the above findings. IMPRESSION: 1. No pulmonary embolus. 2. Mild hypoventilatory changes in the lung bases with subsegmental atelectasis. No other acute intrathoracic abnormality. 3. Cystic right anterior mediastinal mass measuring 6.4 x 3.7 x 10.5 cm is most consistent with pericardial cyst. This is unchanged in size compared from that described on outside CT 06/19/2018, those images are not available for direct comparison. 4. Sequela of prior granulomatous disease. Tiny right upper lobe pulmonary nodule, also described on prior. No follow-up needed if patient is low-risk (and has no known or suspected primary neoplasm). Non-contrast chest CT can be considered in 12 months if patient is high-risk. This recommendation follows the consensus statement: Guidelines for Management of Incidental Pulmonary Nodules Detected on CT Images: From the Fleischner Society 2017; Radiology 2017; 284:228-243. Aortic Atherosclerosis (ICD10-I70.0). Electronically Signed   By: Keith Rake M.D.   On: 07/24/2019 13:02    ____________________________________________  PROCEDURES   Procedure(s) performed (including Critical Care):  .1-3 Lead EKG  Interpretation Performed by: Duffy Bruce, MD Authorized by: Duffy Bruce, MD     Interpretation: abnormal     ECG rate:  90-110   ECG rate assessment: tachycardic     Rhythm: sinus tachycardia     Ectopy: PAC     Conduction: normal   Comments:  Indication: Shortness of breath, tachycardia    ____________________________________________  INITIAL IMPRESSION / MDM / ASSESSMENT AND PLAN / ED COURSE  As part of my medical decision making, I reviewed the following data within the [place] notes reviewed and incorporated, Old chart reviewed, Notes from prior ED visits, and [place]rolled Sub[place] was evaluated in Emergency Department on 07/24/2019 for the symptoms described in the history of present illness. He was evaluated in the context of the global COVID-19 pandemic, which necessitated consideration that the patient might be at risk for infection with the SARS-CoV-2 virus that causes COVID-19. Institutional protocols and algorithms that pertain to the evaluation of patients at risk for COVID-19 are in a state of rapid change based on information released by regulatory bodies including the CDC and federal and state organizations. These policies and algorithms were followed during the patient's care in the ED.  Some ED evaluations and interventions may be delayed as a result of limited staffing during the pandemic.*     Medical Decision Making:  79 yo M with PMhx as above here with SOB, cough. On exam, pt noted to have fairly significant wheezing and L>R rales, c/w underlying COPD/obstructive disease or possible ILD/restrictive disease given his report of being told he had abnormal PFts in the past. Regardless, he remains tachycardic, tachypneic with increased WOB despite nebs, steroids, and tx in ED. CT Angio is largely unrevealing, and fortunately shows no signs of PE. No significant PNA. No evidence of active TB given his  h/o travel. EKG nonischemic and I do not suspect ACS. He does seem to have intermittent pACs on telemetry, so question of arrhythmia as well - would benefit from tele.   ____________________________________________  FINAL CLINICAL IMPRESSION(S) / ED DIAGNOSES  Final diagnoses:  Premature atrial contractions  Shortness of breath     MEDICATIONS GIVEN DURING THIS VISIT:  Medications  aspirin EC tablet 81 mg (has no administration in time range)  enoxaparin (LOVENOX) injection 40 mg (40 mg Subcutaneous Given 07/24/19 1627)  azithromycin (ZITHROMAX) 500 mg in sodium chloride 0.9 % 250 mL IVPB (500 mg Intravenous New Bag/Given 07/24/19 1626)    Followed by  azithromycin (ZITHROMAX) tablet 500 mg (has no administration in time range)  budesonide (PULMICORT) nebulizer solution 2 mg (has no administration in time range)  ipratropium-albuterol (DUONEB) 0.5-2.5 (3) MG/3ML nebulizer solution 3 mL (0 mLs Nebulization Hold 07/24/19 1518)  methylPREDNISolone sodium succinate (SOLU-MEDROL) 125 mg/2 mL injection 60 mg (0 mg Intravenous Hold 07/24/19 1518)    Followed by  predniSONE (DELTASONE) tablet 40 mg (has no administration in time range)  albuterol (PROVENTIL) (2.5 MG/3ML) 0.083% nebulizer solution 2.5 mg (has no administration in time range)  irbesartan (AVAPRO) tablet 300 mg (has no administration in time range)    And  hydrochlorothiazide (MICROZIDE) capsule 12.5 mg (has no administration in time range)  ipratropium-albuterol (DUONEB) 0.5-2.5 (3) MG/3ML nebulizer solution 3 mL (3 mLs Nebulization Given 07/24/19 1130)  ipratropium-albuterol (DUONEB) 0.5-2.5 (3) MG/3ML nebulizer solution 3 mL (3 mLs Nebulization Given 07/24/19 1227)  methylPREDNISolone sodium succinate (SOLU-MEDROL) 125 mg/2 mL injection 125 mg (125 mg Intravenous Given 07/24/19 1217)  HYDROcodone-acetaminophen (NORCO/VICODIN) 5-325 MG per tablet 1 tablet (1 tablet Oral Given 07/24/19 1216)  iohexol (OMNIPAQUE) 350 MG/ML injection 75  mL (75 mLs Intravenous Contrast Given 07/24/19 1240)     ED Discharge Orders    None       Note:  This document was prepared using Dragon voice recognition software and may include unintentional dictation errors.   Duffy Bruce, MD 07/24/19 Darci Current    Duffy Bruce, MD 07/24/19 1753

## 2019-07-24 NOTE — ED Notes (Addendum)
Verbal okay to give pt food per admitting provider Manuella Ghazi. States he will place official diet order once he has seen pt at bedside.

## 2019-07-24 NOTE — ED Notes (Signed)
Bradley Ellis in lab states bnp and d-dimer to be added on now.

## 2019-07-24 NOTE — ED Notes (Signed)
Secretary called dietary for food trays as fridge still empty. Will give once available.

## 2019-07-24 NOTE — ED Notes (Signed)
Pt up to bedside toilet. Will complete blood collection and give meds once pt back to stretcher.

## 2019-07-24 NOTE — ED Notes (Signed)
2nd D-dimer blue tube sent to lab as requested.

## 2019-07-24 NOTE — ED Notes (Signed)
Pt ambulated to restroom independently and with steady gait.

## 2019-07-24 NOTE — ED Triage Notes (Signed)
Pt reports intermittent issues with SOB and CO since he was diagnosed with COVID in September. Pt reports pain in chest is pressure like and he feels like he can not breathe good.

## 2019-07-24 NOTE — ED Notes (Signed)
Pt given meal tray.

## 2019-07-24 NOTE — ED Notes (Signed)
Pt and his wife given snacks and drinks. Food trays not yet available.

## 2019-07-24 NOTE — ED Notes (Signed)
Introduced self to pt. Offered blanket; pt declined. Bed locked low. Rails up. Call bell within reach. Updated on plan. Pt reports chronic neck pain and is requesting something for this pain. EDP Isaacs notified.

## 2019-07-24 NOTE — ED Notes (Signed)
Pt given warm blanket.

## 2019-07-24 NOTE — ED Notes (Signed)
Pt co chronic chest pain worse when he coughs and takes a deep breath, [place] NP made aware. Meds given ordered.

## 2019-07-25 ENCOUNTER — Encounter: Payer: Self-pay | Admitting: Internal Medicine

## 2019-07-25 DIAGNOSIS — B356 Tinea cruris: Secondary | ICD-10-CM

## 2019-07-25 DIAGNOSIS — M545 Low back pain, unspecified: Secondary | ICD-10-CM

## 2019-07-25 DIAGNOSIS — R0602 Shortness of breath: Secondary | ICD-10-CM | POA: Diagnosis not present

## 2019-07-25 DIAGNOSIS — G8929 Other chronic pain: Secondary | ICD-10-CM

## 2019-07-25 DIAGNOSIS — J441 Chronic obstructive pulmonary disease with (acute) exacerbation: Secondary | ICD-10-CM | POA: Diagnosis not present

## 2019-07-25 LAB — HIV ANTIBODY (ROUTINE TESTING W REFLEX): HIV Screen 4th Generation wRfx: NONREACTIVE

## 2019-07-25 MED ORDER — IRBESARTAN-HYDROCHLOROTHIAZIDE 300-12.5 MG PO TABS: 1.0000 | ORAL_TABLET | Freq: Every day | ORAL | 0 refills | Status: DC

## 2019-07-25 MED ORDER — METOPROLOL SUCCINATE ER 50 MG PO TB24
50.0000 mg | ORAL_TABLET | Freq: Every day | ORAL | Status: DC
  Filled 2019-07-25: qty 1

## 2019-07-25 MED ORDER — INFLUENZA VAC A&B SA ADJ QUAD 0.5 ML IM PRSY: 0.5000 mL | PREFILLED_SYRINGE | INTRAMUSCULAR | Status: DC

## 2019-07-25 MED ORDER — CLOTRIMAZOLE 1 % EX CREA: 1.0000 "application " | TOPICAL_CREAM | Freq: Two times a day (BID) | CUTANEOUS | 0 refills | Status: DC

## 2019-07-25 MED ORDER — ACETAMINOPHEN 325 MG PO TABS
650.0000 mg | ORAL_TABLET | Freq: Once | ORAL | Status: AC
  Administered 2019-07-25: 11:00:00 650 mg via ORAL
  Filled 2019-07-25: qty 2

## 2019-07-25 NOTE — Progress Notes (Signed)
Discharge instructions and prescriptions reviewed with patient. Time allowed for questions. Verbalization of understanding. Patient and wife wheeled out to medical mall.

## 2019-07-25 NOTE — Progress Notes (Signed)
OT Cancellation Note  Patient Details Name: Bradley Ellis MRN: [id number] DOB: 12-13-1940   Cancelled Treatment:    Reason Eval/Treat Not Completed: OT screened, no needs identified, will sign off   Patient seen this morning for OT.  Patient is (MOD) I for bathing, dressing and toileting.  Patient reports he frequently walks, works out and is very active with his wife.  No needs identified at this time.  Signing off.  Thank you.  Oren Binet 07/25/2019, 10:36 AM

## 2019-07-25 NOTE — Progress Notes (Signed)
PT Screen Note  Patient Details Name: Bradley Ellis MRN: [id number] DOB: [birth year]   Cancelled Treatment:    Reason Eval/Treat Not Completed: PT screened, no needs identified, will sign off On first attempt pt was showering in the bathroom, on arrival later he was standing in room sorting belongings and more than willing to participate with PT.  Ultimately he is highly functional and does not need further PT intervention.  He had good U&LE strength, was able to ambulate >200 ft and easily negotiate up/down flight of steps.  He reports he walks ~1 mile/day and generally stays quite active. Vitals stable with the effort, no PT needs, will sign off.   Kreg Shropshire, DPT 07/25/2019, 9:57 AM

## 2019-07-25 NOTE — ED Notes (Signed)
Attempted to call report to [phone] x 2 without success. Nurse notified I will be bring pt up and giving bedside report.

## 2019-07-25 NOTE — Progress Notes (Signed)
Patient refused to take Toprol XL. Dr. Manuella Ghazi made aware

## 2019-07-25 NOTE — ED Notes (Signed)
Pt is sleeping on stretcher and appears comfortable. Respirations are equal and unlabored, no signs of acute distress.

## 2019-07-25 NOTE — Discharge Instructions (Signed)
Jock Itch  Jock itch is an itchy rash in the groin and upper thigh area. It is a skin infection that is caused by a type of germ that lives in dark, damp places (fungus). The rash usually goes away in 2-3 weeks with treatment. Follow these instructions at home: Skin care  Use skin creams, ointments, or powders exactly as told by your doctor.  Wear loose-fitting clothes. Clothes should not rub against your groin area. Men should wear boxer shorts or loose-fitting underwear.  Keep your groin area clean and dry. ? Change your underwear every day. ? Change out of wet bathing suits as soon as you can. ? After bathing, use a separate towel to dry your groin area. Dry the area gently and completely.  Avoid hot baths and showers. Hot water can make itching worse.  Do not scratch the area. General instructions  Take and apply over-the-counter and prescription medicines only as told by your doctor.  Do not share towels or clothing with other people.  Wash your hands often with soap and water, especially after touching your groin area. If you do not have soap and water, use alcohol-based hand sanitizer. Contact a doctor if:  Your rash: ? Gets worse. ? Does not get better after 2 weeks of treatment. ? Spreads. ? Comes back after treatment is done.  You have any of the following: ? A fever. ? New or worsening redness, swelling, or pain around your rash. ? Fluid, blood, or pus coming from your rash. Summary  Jock itch is an itchy rash. It affects the groin and upper thigh area.  Jock itch usually goes away in 2-3 weeks with treatment.  Keep your groin area clean and dry. This information is not intended to replace advice given to you by your health care provider. Make sure you discuss any questions you have with your health care provider. Document Revised: 04/06/2017 Document Reviewed: 04/04/2017 Elsevier Patient Education  2020 [employer] Inc.  

## 2019-07-25 NOTE — ED Notes (Signed)
Pt admitted to 127 via wheelchair.

## 2019-07-26 NOTE — Discharge Summary (Signed)
[facility] [place] NAME: Bradley Ellis    MR#:  [id number]  DATE OF BIRTH:  [birth year]  DATE OF ADMISSION:  07/24/2019   ADMITTING PHYSICIAN: Max Sane, MD  DATE OF DISCHARGE: 07/25/2019 12:22 PM  PRIMARY CARE PHYSICIAN: Steele Sizer, MD   ADMISSION DIAGNOSIS:  Shortness of breath [R06.02] Premature atrial contractions [I49.1] COPD exacerbation (HCC) [J44.1] DISCHARGE DIAGNOSIS:  Active Problems:   COPD exacerbation (HCC)   Shortness of breath   Tinea cruris   Chronic midline low back pain without sciatica  SECONDARY DIAGNOSIS:   Past Medical History:  Diagnosis Date  . Chronic bronchitis (HCC)    not from smoking  . 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 GERD (gastroesophageal reflux disease)   . Hyperlipidemia   . Hypertension   . Laryngeal nodule    benign  . Sleep disorder    HOSPITAL COURSE:  8 Y m with k/h/o COPD, recurrent pneumonia, hypertension, hyperlipidemia being admitted for COPD exacerbation  * COPD exacerbation - CT chest neg for PE. Stable pericardial cyst - patient was never hypoxic but had some wheezing which improved with steroids.  * HTN - continue home meds  * Tinea Cruris Given script for clotrimazole cream DISCHARGE CONDITIONS:  stable CONSULTS OBTAINED:   DRUG ALLERGIES:   Allergies  Allergen Reactions  . Sulfa Antibiotics Rash and Hives   DISCHARGE MEDICATIONS:   Allergies as of 07/25/2019      Reactions   Sulfa Antibiotics Rash, Hives      Medication List    STOP taking these medications   atorvastatin 10 MG tablet Commonly known as: LIPITOR   candesartan 32 MG tablet Commonly known as: ATACAND   hydrochlorothiazide 12.5 MG capsule Commonly known as: MICROZIDE   metoprolol succinate 50 MG 24 hr tablet Commonly known as: TOPROL-XL   pantoprazole 40 MG tablet Commonly known as: PROTONIX   traZODone 50 MG tablet Commonly known as: DESYREL     TAKE these  medications   aspirin EC 81 MG tablet Take 81 mg by mouth daily.   carboxymethylcellulose 0.5 % Soln Commonly known as: REFRESH PLUS Place 1 drop into both eyes 3 (three) times daily as needed. Notes to patient: Not given during this hospitalization   clotrimazole 1 % cream Commonly known as: Clotrimazole Anti-Fungal Apply 1 application topically 2 (two) times daily. Notes to patient: Not given during this hospitalization   furosemide 40 MG tablet Commonly known as: LASIX Take 40 mg by mouth as needed for fluid. Notes to patient: Not given during this hospitalization   hydrALAZINE 10 MG tablet Commonly known as: APRESOLINE Take 1 tablet (10 mg total) by mouth 3 (three) times daily as needed. bp above 150/90   irbesartan-hydrochlorothiazide 300-12.5 MG tablet Commonly known as: AVALIDE Take 1 tablet by mouth daily.   loperamide 2 MG tablet Commonly known as: IMODIUM A-D Take 2 mg by mouth 4 (four) times daily as needed for diarrhea or loose stools. Notes to patient: Not given during this hospitalization   loratadine 10 MG tablet Commonly known as: CLARITIN Take 10 mg by mouth daily. Notes to patient: Not given during this hospitalization   sildenafil 50 MG tablet Commonly known as: VIAGRA Take 50 mg by mouth daily as needed for erectile dysfunction. Notes to patient: Not given during this hospitalization   VITAMIN B-3 PO Take 1 tablet by mouth daily. VITAL FUERTE H3 100MG  CAPSULE Notes to patient: Not given during this  hospitalization   Yohimbe Bark 500 MG Caps Take 1 capsule by mouth 2 (two) times daily. Notes to patient: Not given during this hospitalization      DISCHARGE INSTRUCTIONS:   DIET:  Regular diet DISCHARGE CONDITION:  Stable ACTIVITY:  Activity as tolerated OXYGEN:  Home Oxygen: No.  Oxygen Delivery: room air DISCHARGE LOCATION:  home   If you experience worsening of your admission symptoms, develop shortness of breath, life threatening  emergency, suicidal or homicidal thoughts you must seek medical attention immediately by calling 911 or calling your MD immediately  if symptoms less severe.  You Must read complete instructions/literature along with all the possible adverse reactions/side effects for all the Medicines you take and that have been prescribed to you. Take any new Medicines after you have completely understood and accpet all the possible adverse reactions/side effects.   Please note  You were cared for by a hospitalist during your hospital stay. If you have any questions about your discharge medications or the care you received while you were in the hospital after you are discharged, you can call the unit and asked to speak with the hospitalist on call if the hospitalist that took care of you is not available. Once you are discharged, your primary care physician will handle any further medical issues. Please note that NO REFILLS for any discharge medications will be authorized once you are discharged, as it is imperative that you return to your primary care physician (or establish a relationship with a primary care physician if you do not have one) for your aftercare needs so that they can reassess your need for medications and monitor your lab values.    On the day of Discharge:  VITAL SIGNS:  Blood pressure (!) 146/85, pulse (!) 101, temperature (!) 97.5 F (36.4 C), temperature source Oral, resp. rate 17, height 5\' 8"  (1.727 m), weight 63.5 kg, SpO2 96 %. PHYSICAL EXAMINATION:  GENERAL:  79 y.o.-year-old patient lying in the bed with no acute distress.  EYES: Pupils equal, round, reactive to light and accommodation. No scleral icterus. Extraocular muscles intact.  HEENT: Head atraumatic, normocephalic. Oropharynx and nasopharynx clear.  NECK:  Supple, no jugular venous distention. No thyroid enlargement, no tenderness.  LUNGS: Normal breath sounds bilaterally, no wheezing, rales,rhonchi or crepitation. No use of  accessory muscles of respiration.  CARDIOVASCULAR: S1, S2 normal. No murmurs, rubs, or gallops.  ABDOMEN: Soft, non-tender, non-distended. Bowel sounds present. No organomegaly or mass.  EXTREMITIES: No pedal edema, cyanosis, or clubbing.  NEUROLOGIC: Cranial nerves II through XII are intact. Muscle strength 5/5 in all extremities. Sensation intact. Gait not checked.  PSYCHIATRIC: The patient is alert and oriented x 3.  SKIN: No obvious rash, lesion, or ulcer.  DATA REVIEW:   CBC Recent Labs  Lab 07/24/19 1004  WBC 5.3  HGB 13.7  HCT 41.8  PLT 224    Chemistries  Recent Labs  Lab 07/24/19 1004  NA 139  K 4.0  CL 104  CO2 26  GLUCOSE 150*  BUN 26*  CREATININE 1.26*  CALCIUM 8.7*     Outpatient follow-up Follow-up Information    Steele Sizer, MD. Go on 08/15/2019.   Specialty: Family Medicine Why: @ 11:00 am  (Note:  Patient will also be put on Waiting List for earlier Appt)   Contact information: [address] [place] [address] [phone]            Management plans discussed with the patient, family and  they are in agreement.  CODE STATUS: Prior   TOTAL TIME TAKING CARE OF THIS PATIENT: 35 minutes.    Max Sane M.D on 07/26/2019 at 4:55 PM  [facility]   CC: Primary care physician; Steele Sizer, MD   Note: This dictation was prepared with Dragon dictation along with smaller phrase technology. Any transcriptional errors that result from this process are unintentional.

## 2019-07-29 ENCOUNTER — Ambulatory Visit: Payer: Self-pay | Admitting: Family Medicine

## 2019-07-29 NOTE — Telephone Encounter (Signed)
Pt states he went to ED 07/24/2019 for SOB. States has not improved, worse today. States SOB at rest, dizzy at times,"Bent over and felt like I was going to pass out." Also reports wheezing, chest heavy 3/10.  Pt states CXR in ED was negative for pneumonia, "Was given steroids." States he returned from [place] "Beginning of March." Has not been covid tested since return to states. Pt made aware he would not be seen in office due to covid screening.  Advised ED, pt states he will follow disposition. States he will go to [facility] as  "I have a lung specialist there." Assured pt NT would route t o practice for Dr. Ancil Boozer review.   Answer Assessment - Initial Assessment Questions 1. RESPIRATORY STATUS: "Describe your breathing?" (e.g., wheezing, shortness of breath, unable to speak, severe coughing)      SOB  2. ONSET: "When did this breathing problem begin?"      Gradual worsening 3. PATTERN "Does the difficult breathing come and go, or has it been constant since it started?"      Weeks ago, ongoing 4. SEVERITY: "How bad is your breathing?" (e.g., mild, moderate, severe)    - MILD: No SOB at rest, mild SOB with walking, speaks normally in sentences, can lay down, no retractions, pulse < 100.    - MODERATE: SOB at rest, SOB with minimal exertion and prefers to sit, cannot lie down flat, speaks in phrases, mild retractions, audible wheezing, pulse 100-120.    - SEVERE: Very SOB at rest, speaks in single words, struggling to breathe, sitting hunched forward, retractions, pulse > 120      Moderate 5. RECURRENT SYMPTOM: "Have you had difficulty breathing before?" If so, ask: "When was the last time?" and "What happened that time?"      "Had for years" 6. CARDIAC HISTORY: "Do you have any history of heart disease?" (e.g., heart attack, angina, bypass surgery, angioplasty)      Afib, AAA 7. LUNG HISTORY: "Do you have any history of lung disease?"  (e.g., pulmonary embolus, asthma,  emphysema)     COPD 8. CAUSE: "What do you think is causing the breathing problem?"      "Not sure why worse now" 9. OTHER SYMPTOMS: "Do you have any other symptoms? (e.g., dizziness, runny nose, cough, chest pain, fever)     Dizziness when bending over, several times in past, 3/10 heaviness in chest  Protocols used: BREATHING DIFFICULTY-A-AH

## 2019-07-31 ENCOUNTER — Other Ambulatory Visit: Payer: Self-pay | Admitting: Family Medicine

## 2019-07-31 DIAGNOSIS — R9389 Abnormal findings on diagnostic imaging of other specified body structures: Secondary | ICD-10-CM

## 2019-08-04 DIAGNOSIS — J9859 Other diseases of mediastinum, not elsewhere classified: Secondary | ICD-10-CM | POA: Diagnosis not present

## 2019-08-04 DIAGNOSIS — J449 Chronic obstructive pulmonary disease, unspecified: Secondary | ICD-10-CM | POA: Diagnosis not present

## 2019-08-04 DIAGNOSIS — Z905 Acquired absence of kidney: Secondary | ICD-10-CM | POA: Diagnosis not present

## 2019-08-04 DIAGNOSIS — Z20822 Contact with and (suspected) exposure to covid-19: Secondary | ICD-10-CM | POA: Diagnosis not present

## 2019-08-04 DIAGNOSIS — I4891 Unspecified atrial fibrillation: Secondary | ICD-10-CM | POA: Diagnosis not present

## 2019-08-04 DIAGNOSIS — I714 Abdominal aortic aneurysm, without rupture: Secondary | ICD-10-CM | POA: Diagnosis not present

## 2019-08-04 DIAGNOSIS — C649 Malignant neoplasm of unspecified kidney, except renal pelvis: Secondary | ICD-10-CM | POA: Diagnosis not present

## 2019-08-04 DIAGNOSIS — R55 Syncope and collapse: Secondary | ICD-10-CM | POA: Diagnosis not present

## 2019-08-04 DIAGNOSIS — I340 Nonrheumatic mitral (valve) insufficiency: Secondary | ICD-10-CM | POA: Diagnosis not present

## 2019-08-04 DIAGNOSIS — I10 Essential (primary) hypertension: Secondary | ICD-10-CM | POA: Diagnosis not present

## 2019-08-04 DIAGNOSIS — E785 Hyperlipidemia, unspecified: Secondary | ICD-10-CM | POA: Diagnosis not present

## 2019-08-04 DIAGNOSIS — Z79899 Other long term (current) drug therapy: Secondary | ICD-10-CM | POA: Diagnosis not present

## 2019-08-04 DIAGNOSIS — Z7982 Long term (current) use of aspirin: Secondary | ICD-10-CM | POA: Diagnosis not present

## 2019-08-04 DIAGNOSIS — M542 Cervicalgia: Secondary | ICD-10-CM | POA: Diagnosis not present

## 2019-08-04 DIAGNOSIS — R0789 Other chest pain: Secondary | ICD-10-CM | POA: Diagnosis not present

## 2019-08-04 DIAGNOSIS — Z7902 Long term (current) use of antithrombotics/antiplatelets: Secondary | ICD-10-CM | POA: Diagnosis not present

## 2019-08-04 DIAGNOSIS — R5383 Other fatigue: Secondary | ICD-10-CM | POA: Diagnosis not present

## 2019-08-05 DIAGNOSIS — J9859 Other diseases of mediastinum, not elsewhere classified: Secondary | ICD-10-CM | POA: Diagnosis not present

## 2019-08-05 DIAGNOSIS — R079 Chest pain, unspecified: Secondary | ICD-10-CM | POA: Diagnosis not present

## 2019-08-05 DIAGNOSIS — R55 Syncope and collapse: Secondary | ICD-10-CM | POA: Diagnosis not present

## 2019-08-05 DIAGNOSIS — I10 Essential (primary) hypertension: Secondary | ICD-10-CM | POA: Diagnosis not present

## 2019-08-05 DIAGNOSIS — R531 Weakness: Secondary | ICD-10-CM | POA: Diagnosis not present

## 2019-08-05 DIAGNOSIS — R918 Other nonspecific abnormal finding of lung field: Secondary | ICD-10-CM | POA: Diagnosis not present

## 2019-08-05 DIAGNOSIS — R5383 Other fatigue: Secondary | ICD-10-CM | POA: Diagnosis not present

## 2019-08-06 DIAGNOSIS — J9859 Other diseases of mediastinum, not elsewhere classified: Secondary | ICD-10-CM | POA: Diagnosis not present

## 2019-08-06 DIAGNOSIS — I10 Essential (primary) hypertension: Secondary | ICD-10-CM | POA: Diagnosis not present

## 2019-08-06 DIAGNOSIS — R55 Syncope and collapse: Secondary | ICD-10-CM | POA: Diagnosis not present

## 2019-08-06 MED ORDER — CALCIUM CARBONATE 1250 (500 CA) MG PO CHEW
CHEWABLE_TABLET | ORAL | Status: DC
Start: ? — End: 2019-08-06

## 2019-08-06 MED ORDER — ALBUTEROL SULFATE (2.5 MG/3ML) 0.083% IN NEBU
2.50 | INHALATION_SOLUTION | RESPIRATORY_TRACT | Status: DC
Start: ? — End: 2019-08-06

## 2019-08-06 MED ORDER — GUAIFENESIN 100 MG/5ML PO SYRP
200.00 | ORAL_SOLUTION | ORAL | Status: DC
Start: ? — End: 2019-08-06

## 2019-08-06 MED ORDER — ASPIRIN 81 MG PO CHEW
81.00 | CHEWABLE_TABLET | ORAL | Status: DC
Start: 2019-08-07 — End: 2019-08-06

## 2019-08-06 MED ORDER — ACETAMINOPHEN 325 MG PO TABS
650.00 | ORAL_TABLET | ORAL | Status: DC
Start: ? — End: 2019-08-06

## 2019-08-06 MED ORDER — MELATONIN 3 MG PO TABS
6.00 | ORAL_TABLET | ORAL | Status: DC
Start: ? — End: 2019-08-06

## 2019-08-10 DIAGNOSIS — G478 Other sleep disorders: Secondary | ICD-10-CM | POA: Diagnosis not present

## 2019-08-10 DIAGNOSIS — R55 Syncope and collapse: Secondary | ICD-10-CM | POA: Diagnosis not present

## 2019-08-13 ENCOUNTER — Telehealth: Payer: Medicare Other | Admitting: Family Medicine

## 2019-08-15 ENCOUNTER — Other Ambulatory Visit: Payer: Self-pay | Admitting: Emergency Medicine

## 2019-08-15 ENCOUNTER — Encounter: Payer: Self-pay | Admitting: Family Medicine

## 2019-08-15 ENCOUNTER — Other Ambulatory Visit: Payer: Self-pay

## 2019-08-15 ENCOUNTER — Ambulatory Visit (INDEPENDENT_AMBULATORY_CARE_PROVIDER_SITE_OTHER): Payer: Medicare Other | Admitting: Family Medicine

## 2019-08-15 VITALS — BP 140/90 | HR 98 | Temp 97.1°F | Resp 16 | Ht 68.0 in | Wt 140.9 lb

## 2019-08-15 DIAGNOSIS — Z87898 Personal history of other specified conditions: Secondary | ICD-10-CM | POA: Diagnosis not present

## 2019-08-15 DIAGNOSIS — N1831 Chronic kidney disease, stage 3a: Secondary | ICD-10-CM | POA: Diagnosis not present

## 2019-08-15 DIAGNOSIS — J410 Simple chronic bronchitis: Secondary | ICD-10-CM

## 2019-08-15 DIAGNOSIS — F5101 Primary insomnia: Secondary | ICD-10-CM

## 2019-08-15 DIAGNOSIS — R739 Hyperglycemia, unspecified: Secondary | ICD-10-CM

## 2019-08-15 DIAGNOSIS — I10 Essential (primary) hypertension: Secondary | ICD-10-CM | POA: Diagnosis not present

## 2019-08-15 DIAGNOSIS — I480 Paroxysmal atrial fibrillation: Secondary | ICD-10-CM | POA: Diagnosis not present

## 2019-08-15 DIAGNOSIS — K219 Gastro-esophageal reflux disease without esophagitis: Secondary | ICD-10-CM | POA: Diagnosis not present

## 2019-08-15 DIAGNOSIS — Z09 Encounter for follow-up examination after completed treatment for conditions other than malignant neoplasm: Secondary | ICD-10-CM

## 2019-08-15 DIAGNOSIS — G629 Polyneuropathy, unspecified: Secondary | ICD-10-CM

## 2019-08-15 DIAGNOSIS — K269 Duodenal ulcer, unspecified as acute or chronic, without hemorrhage or perforation: Secondary | ICD-10-CM | POA: Diagnosis not present

## 2019-08-15 DIAGNOSIS — R413 Other amnesia: Secondary | ICD-10-CM | POA: Diagnosis not present

## 2019-08-15 DIAGNOSIS — K551 Chronic vascular disorders of intestine: Secondary | ICD-10-CM | POA: Diagnosis not present

## 2019-08-15 LAB — POCT GLYCOSYLATED HEMOGLOBIN (HGB A1C): Hemoglobin A1C: 5.6 % (ref 4.0–5.6)

## 2019-08-15 MED ORDER — PANTOPRAZOLE SODIUM 40 MG PO TBEC: 40.0000 mg | DELAYED_RELEASE_TABLET | Freq: Every day | ORAL | 1 refills | Status: DC

## 2019-08-15 MED ORDER — GABAPENTIN 300 MG PO CAPS: 300.0000 mg | ORAL_CAPSULE | Freq: Every day | ORAL | 0 refills | Status: AC

## 2019-08-15 MED ORDER — IRBESARTAN-HYDROCHLOROTHIAZIDE 300-12.5 MG PO TABS: 1.0000 | ORAL_TABLET | Freq: Every day | ORAL | 0 refills | Status: DC

## 2019-08-15 MED ORDER — ATORVASTATIN CALCIUM 10 MG PO TABS: 10.0000 mg | ORAL_TABLET | Freq: Every day | ORAL | 1 refills | Status: DC

## 2019-08-15 MED ORDER — TRAZODONE HCL 50 MG PO TABS: 50.0000 mg | ORAL_TABLET | Freq: Every day | ORAL | 0 refills | Status: DC
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
## 2019-09-03 ENCOUNTER — Telehealth: Payer: Self-pay

## 2019-09-03 NOTE — Telephone Encounter (Signed)
Patient called and said he was suppose to have EEG done and Alzheimer's done. I'm not sure what he is talking about. Please advise.

## 2019-09-04 NOTE — Telephone Encounter (Signed)
Pt called asking about the referral for the EEG at [facility].    His contact number is [phone]

## 2019-09-05 NOTE — Telephone Encounter (Signed)
Called patient to explain that neurology would do further evaluation for memory. He said he has not gotten a call about appointment. Please advise.

## 2019-09-06 DIAGNOSIS — Z23 Encounter for immunization: Secondary | ICD-10-CM | POA: Diagnosis not present

## 2019-09-13 DIAGNOSIS — M25562 Pain in left knee: Secondary | ICD-10-CM | POA: Diagnosis not present

## 2019-09-13 DIAGNOSIS — I10 Essential (primary) hypertension: Secondary | ICD-10-CM | POA: Diagnosis not present

## 2019-09-13 DIAGNOSIS — R0989 Other specified symptoms and signs involving the circulatory and respiratory systems: Secondary | ICD-10-CM | POA: Diagnosis not present

## 2019-09-13 DIAGNOSIS — L989 Disorder of the skin and subcutaneous tissue, unspecified: Secondary | ICD-10-CM | POA: Diagnosis not present

## 2019-09-15 DIAGNOSIS — R0689 Other abnormalities of breathing: Secondary | ICD-10-CM | POA: Diagnosis not present

## 2019-09-23 DIAGNOSIS — I10 Essential (primary) hypertension: Secondary | ICD-10-CM | POA: Diagnosis not present

## 2019-09-23 DIAGNOSIS — Z0000 Encounter for general adult medical examination without abnormal findings: Secondary | ICD-10-CM | POA: Diagnosis not present

## 2019-09-23 DIAGNOSIS — Z594 Lack of adequate food and safe drinking water: Secondary | ICD-10-CM | POA: Diagnosis not present

## 2019-09-23 DIAGNOSIS — Z23 Encounter for immunization: Secondary | ICD-10-CM | POA: Diagnosis not present

## 2019-09-23 DIAGNOSIS — M25562 Pain in left knee: Secondary | ICD-10-CM | POA: Diagnosis not present

## 2019-09-23 DIAGNOSIS — R0989 Other specified symptoms and signs involving the circulatory and respiratory systems: Secondary | ICD-10-CM | POA: Diagnosis not present

## 2019-09-24 DIAGNOSIS — R55 Syncope and collapse: Secondary | ICD-10-CM | POA: Diagnosis not present

## 2019-09-24 DIAGNOSIS — I714 Abdominal aortic aneurysm, without rupture: Secondary | ICD-10-CM | POA: Diagnosis not present

## 2019-09-24 DIAGNOSIS — Z09 Encounter for follow-up examination after completed treatment for conditions other than malignant neoplasm: Secondary | ICD-10-CM | POA: Diagnosis not present

## 2019-09-24 DIAGNOSIS — I10 Essential (primary) hypertension: Secondary | ICD-10-CM | POA: Diagnosis not present

## 2019-09-24 DIAGNOSIS — Z95828 Presence of other vascular implants and grafts: Secondary | ICD-10-CM | POA: Diagnosis not present

## 2019-09-24 DIAGNOSIS — H903 Sensorineural hearing loss, bilateral: Secondary | ICD-10-CM | POA: Diagnosis not present

## 2019-10-02 DIAGNOSIS — R55 Syncope and collapse: Secondary | ICD-10-CM | POA: Diagnosis not present

## 2019-10-04 DIAGNOSIS — R569 Unspecified convulsions: Secondary | ICD-10-CM | POA: Diagnosis not present

## 2019-10-13 DIAGNOSIS — M1712 Unilateral primary osteoarthritis, left knee: Secondary | ICD-10-CM | POA: Diagnosis not present

## 2019-10-13 DIAGNOSIS — S29012A Strain of muscle and tendon of back wall of thorax, initial encounter: Secondary | ICD-10-CM | POA: Diagnosis not present

## 2019-10-13 DIAGNOSIS — M542 Cervicalgia: Secondary | ICD-10-CM | POA: Diagnosis not present

## 2019-10-18 ENCOUNTER — Other Ambulatory Visit: Payer: Self-pay

## 2019-10-18 ENCOUNTER — Emergency Department
Admission: EM | Admit: 2019-10-18 | Discharge: 2019-10-18 | Disposition: A | Discharge status: Home / Self Care | Payer: Medicare HMO | Attending: Emergency Medicine | Admitting: Emergency Medicine

## 2019-10-18 ENCOUNTER — Emergency Department: Payer: Medicare HMO

## 2019-10-18 DIAGNOSIS — Z79899 Other long term (current) drug therapy: Secondary | ICD-10-CM | POA: Insufficient documentation

## 2019-10-18 DIAGNOSIS — J441 Chronic obstructive pulmonary disease with (acute) exacerbation: Secondary | ICD-10-CM | POA: Insufficient documentation

## 2019-10-18 DIAGNOSIS — Z7982 Long term (current) use of aspirin: Secondary | ICD-10-CM | POA: Insufficient documentation

## 2019-10-18 DIAGNOSIS — R9431 Abnormal electrocardiogram [ECG] [EKG]: Secondary | ICD-10-CM | POA: Diagnosis not present

## 2019-10-18 DIAGNOSIS — I10 Essential (primary) hypertension: Secondary | ICD-10-CM | POA: Diagnosis not present

## 2019-10-18 DIAGNOSIS — R0602 Shortness of breath: Secondary | ICD-10-CM | POA: Diagnosis not present

## 2019-10-18 LAB — BASIC METABOLIC PANEL
Anion gap: 11 (ref 5–15)
BUN: 30 mg/dL — ABNORMAL HIGH (ref 8–23)
CO2: 26 mmol/L (ref 22–32)
Calcium: 9.2 mg/dL (ref 8.9–10.3)
Chloride: 100 mmol/L (ref 98–111)
Creatinine, Ser: 1.33 mg/dL — ABNORMAL HIGH (ref 0.61–1.24)
GFR calc Af Amer: 58 mL/min — ABNORMAL LOW (ref >60)
GFR calc non Af Amer: 50 mL/min — ABNORMAL LOW (ref >60)
Glucose, Bld: 99 mg/dL (ref 70–99)
Potassium: 4.1 mmol/L (ref 3.5–5.1)
Sodium: 137 mmol/L (ref 135–145)

## 2019-10-18 LAB — CBC
HCT: 43.2 % (ref 39.0–52.0)
Hemoglobin: 14.3 g/dL (ref 13.0–17.0)
MCH: 29.9 pg (ref 26.0–34.0)
MCHC: 33.1 g/dL (ref 30.0–36.0)
MCV: 90.2 fL (ref 80.0–100.0)
Platelets: 185 10*3/uL (ref 150–400)
RBC: 4.79 MIL/uL (ref 4.22–5.81)
RDW: 12.8 % (ref 11.5–15.5)
WBC: 7.8 10*3/uL (ref 4.0–10.5)
nRBC: 0 % (ref 0.0–0.2)

## 2019-10-18 LAB — TROPONIN I (HIGH SENSITIVITY)
Troponin I (High Sensitivity): 4 ng/L (ref <18)
Troponin I (High Sensitivity): 5 ng/L (ref <18)

## 2019-10-18 MED ORDER — IPRATROPIUM-ALBUTEROL 0.5-2.5 (3) MG/3ML IN SOLN
3.0000 mL | Freq: Once | RESPIRATORY_TRACT | Status: AC
  Administered 2019-10-18: 3 mL via RESPIRATORY_TRACT
  Filled 2019-10-18: qty 3

## 2019-10-18 MED ORDER — PREDNISONE 10 MG (21) PO TBPK
ORAL_TABLET | ORAL | 0 refills | Status: DC
Start: 2019-10-18 — End: 2021-07-22

## 2019-10-18 MED ORDER — PREDNISONE 20 MG PO TABS
60.0000 mg | ORAL_TABLET | Freq: Once | ORAL | Status: AC
  Administered 2019-10-18: 60 mg via ORAL
  Filled 2019-10-18: qty 3

## 2019-10-18 MED ORDER — AZITHROMYCIN 250 MG PO TABS: ORAL_TABLET | ORAL | 0 refills | Status: AC

## 2019-10-18 NOTE — ED Notes (Signed)
Pt done with neb treatments

## 2019-10-18 NOTE — ED Triage Notes (Signed)
Pt comes POV with SOB starting this morning. Pt states that he used his nebulizer and did not get relief. Pt AOx4. Wife outside waiting room speaks [place] only. NAD at this time. Pt voice is hoarse.

## 2019-10-18 NOTE — ED Notes (Signed)
Pt eating food- will start neb treatments when pt done eating

## 2019-10-18 NOTE — ED Provider Notes (Signed)
[facility] Emergency Department Provider Note   ____________________________________________   I have reviewed the triage vital signs and the nursing notes.   HISTORY  Chief Complaint Shortness of Breath   History limited by: Not Limited   HPI Bradley Ellis is a 79 y.o. male who presents to the emergency department today because of concerns for shortness of breath.  Patient states that he has a history of breathing difficulty.  He has been trying an inhaler without any significant relief.  Patient has had a some cough.  He denies any fevers.  Says that he has been given nebulizers in the past that have helped.  Patient denies any known sick contacts.  Records reviewed. Per medical record review patient has a history of COPD  Past Medical History:  Diagnosis Date  . Chronic bronchitis (HCC)    not from smoking  . 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 GERD (gastroesophageal reflux disease)   . Hyperlipidemia   . Hypertension   . Laryngeal nodule    benign  . Sleep disorder     Patient Active Problem List   Diagnosis Date Noted  . Shortness of breath   . Tinea cruris   . Chronic midline low back pain without sciatica   . COPD exacerbation ([place]) 07/24/2019  . 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 PUD (peptic ulcer disease) 06/20/2018  . 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 Primary insomnia 04/29/2018  . History of kidney cancer 04/29/2018  . 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 Chronic kidney disease, stage III (moderate) 04/29/2018  . Diastolic dysfunction 80/99/8338  . Mild mitral regurgitation 04/17/2018  . Tricuspid regurgitation 04/17/2018  . Duodenal ulcer without hemorrhage or perforation 04/09/2018  . Neuropathy 04/09/2018  . Paroxysmal A-fib ([place]) 04/03/2018  . Aneurysm of abdominal aorta (HCC) 04/03/2018  . Pain in both lower extremities 11/19/2014  . Sleep disorder   . Hypertension   . GERD (gastroesophageal reflux disease)    . Chronic bronchitis ([place])   . Hyperlipidemia     Past Surgical History:  Procedure Laterality Date  . BREAST CYST EXCISION Bilateral    also in groin and by rectum at other times (7)  . CERVICAL DISCECTOMY  [id number]  . ELBOW ARTHROPLASTY Left 1962  . KNEE ARTHROSCOPY Left    2 arthroscopy. Another surgery due to infection (cactus needle)  . NEPHRECTOMY Right 1998   for cancer. Done at [facility]. No other Rx  . PROSTATE SURGERY     about 15 yrs ago  . SKIN CANCER EXCISION     several in various places  . TONSILLECTOMY AND ADENOIDECTOMY  1962    Prior to Admission medications   Medication Sig Start Date End Date Taking? Authorizing Provider  aspirin EC 81 MG tablet Take 81 mg by mouth daily.    [provider]  atorvastatin (LIPITOR) 10 MG tablet Take 1 tablet (10 mg total) by mouth daily. 08/15/19   Steele Sizer, MD  carboxymethylcellulose (REFRESH PLUS) 0.5 % SOLN Place 1 drop into both eyes 3 (three) times daily as needed.    [provider]  furosemide (LASIX) 40 MG tablet Take 40 mg by mouth as needed for fluid.    [provider]  gabapentin (NEURONTIN) 300 MG capsule Take 1 capsule (300 mg total) by mouth at bedtime. 08/15/19   Steele Sizer, MD  irbesartan-hydrochlorothiazide (AVALIDE) 300-12.5 MG tablet Take 1 tablet by mouth daily. 08/15/19   Steele Sizer, MD  loperamide (IMODIUM A-D) 2 MG tablet Take 2 mg by mouth 4 (four) times daily as needed for diarrhea or loose stools.    [provider]  loratadine (CLARITIN) 10 MG tablet Take 10 mg by mouth daily.    [provider]  Niacin (VITAMIN B-3 PO) Take 1 tablet by mouth daily. VITAL FUERTE H3 100MG  CAPSULE    [provider]  pantoprazole (PROTONIX) 40 MG tablet Take 1 tablet (40 mg total) by mouth daily. 08/15/19   Steele Sizer, MD  traZODone (DESYREL) 50 MG tablet Take 1 tablet (50 mg total) by mouth at bedtime. 08/15/19   Sowles, Drue Stager, MD  Yohimbe Bark 500  MG CAPS Take 1 capsule by mouth 2 (two) times daily.    [provider]    Allergies Sulfa antibiotics  Family History  Problem Relation Age of Onset  . Cancer Mother        breast cancer  . Heart disease Sister   . Stroke Sister   . Diabetes Sister   . Diabetes Maternal Grandmother     Social History Social History   Tobacco Use  . Smoking status: Never Smoker  . Smokeless tobacco: Never Used  Vaping Use  . Vaping Use: Never used  Substance Use Topics  . Alcohol use: No  . Drug use: No    Review of Systems Constitutional: No fever/chills Eyes: No visual changes. ENT: No sore throat. Cardiovascular: Denies chest pain. Respiratory: Positive for shortness of breath. Gastrointestinal: No abdominal pain.  No nausea, no vomiting.  No diarrhea.   Genitourinary: Negative for dysuria. Musculoskeletal: Negative for back pain. Skin: Negative for rash. Neurological: Negative for headaches, focal weakness or numbness.  ____________________________________________   PHYSICAL EXAM:  VITAL SIGNS: ED Triage Vitals  Enc Vitals Group     BP 10/18/19 1134 (!) 130/99     Pulse Rate 10/18/19 1134 90     Resp 10/18/19 1134 18     Temp 10/18/19 1134 98.3 F (36.8 C)     Temp Source 10/18/19 1134 Oral     SpO2 10/18/19 1134 96 %     Weight 10/18/19 1135 150 lb (68 kg)     Height 10/18/19 1135 5\' 8"  (1.727 m)     Head Circumference --      Peak Flow --      Pain Score 10/18/19 1137 4   Constitutional: Alert and oriented.  Eyes: Conjunctivae are normal.  ENT      Head: Normocephalic and atraumatic.      Nose: No congestion/rhinnorhea.      Mouth/Throat: Mucous membranes are moist.      Neck: No stridor. Hematological/Lymphatic/Immunilogical: No cervical lymphadenopathy. Cardiovascular: Normal rate, regular rhythm.  No murmurs, rubs, or gallops.  Respiratory: Normal respiratory effort without tachypnea nor retractions. Breath sounds are clear and equal  bilaterally. No wheezes/rales/rhonchi. Gastrointestinal: Soft and non tender. No rebound. No guarding.  Genitourinary: Deferred Musculoskeletal: Normal range of motion in all extremities. No lower extremity edema. Neurologic:  Normal speech and language. No gross focal neurologic deficits are appreciated.  Skin:  Skin is warm, dry and intact. No rash noted. Psychiatric: Mood and affect are normal. Speech and behavior are normal. Patient exhibits appropriate insight and judgment.  ____________________________________________    LABS (pertinent positives/negatives)  Trop hs 5 BMP wnl except bun 30, cr 1.33 CBC wbc 7.8, hgb 14.3, plt 185  ____________________________________________   EKG  I, Nance Pear, attending physician, personally viewed and interpreted this EKG  EKG Time: 1127 Rate: 86 Rhythm: normal sinus rhythm Axis: normal Intervals: qtc 459 QRS: RBBB ST changes: no st elevation Impression: abnormal ekg ____________________________________________    RADIOLOGY  CXR No acute abnormality. Mass arising from right side of heart again noted.  ____________________________________________   PROCEDURES  Procedures  ____________________________________________   INITIAL IMPRESSION / ASSESSMENT AND PLAN / ED COURSE  Pertinent labs & imaging results that were available during my care of the patient were reviewed by me and considered in my medical decision making (see chart for details).   Patient presented to the emergency department today because of concerns for breathing difficulty.  Patient has a history of COPD.  Chest x-ray without any  acute findings.  He did feel better after DuoNeb treatments.  Will plan on giving patient prescription for further steroids as well as antibiotics given COPD exacerbation. ____________________________________________   FINAL CLINICAL IMPRESSION(S) / ED DIAGNOSES  Final diagnoses:  COPD exacerbation ([place])     Note:  This dictation was prepared with Dragon dictation. Any transcriptional errors that result from this process are unintentional     Nance Pear, MD 10/18/19 [phone]

## 2019-10-18 NOTE — Discharge Instructions (Addendum)
Please seek medical attention for any high fevers, chest pain, shortness of breath, change in behavior, persistent vomiting, bloody stool or any other new or concerning symptoms.  

## 2019-10-20 DIAGNOSIS — D0439 Carcinoma in situ of skin of other parts of face: Secondary | ICD-10-CM | POA: Diagnosis not present

## 2019-10-20 DIAGNOSIS — D485 Neoplasm of uncertain behavior of skin: Secondary | ICD-10-CM | POA: Diagnosis not present

## 2019-10-20 DIAGNOSIS — D1801 Hemangioma of skin and subcutaneous tissue: Secondary | ICD-10-CM | POA: Diagnosis not present

## 2019-10-20 DIAGNOSIS — L570 Actinic keratosis: Secondary | ICD-10-CM | POA: Diagnosis not present

## 2019-10-22 DIAGNOSIS — Q141 Congenital malformation of retina: Secondary | ICD-10-CM | POA: Diagnosis not present

## 2019-10-22 DIAGNOSIS — Z0100 Encounter for examination of eyes and vision without abnormal findings: Secondary | ICD-10-CM | POA: Diagnosis not present

## 2019-10-23 DIAGNOSIS — Z882 Allergy status to sulfonamides status: Secondary | ICD-10-CM | POA: Diagnosis not present

## 2019-10-23 DIAGNOSIS — C649 Malignant neoplasm of unspecified kidney, except renal pelvis: Secondary | ICD-10-CM | POA: Diagnosis not present

## 2019-10-23 DIAGNOSIS — J449 Chronic obstructive pulmonary disease, unspecified: Secondary | ICD-10-CM | POA: Diagnosis not present

## 2019-10-23 DIAGNOSIS — I10 Essential (primary) hypertension: Secondary | ICD-10-CM | POA: Diagnosis not present

## 2019-10-23 DIAGNOSIS — M5412 Radiculopathy, cervical region: Secondary | ICD-10-CM | POA: Diagnosis not present

## 2019-10-23 DIAGNOSIS — M25512 Pain in left shoulder: Secondary | ICD-10-CM | POA: Diagnosis not present

## 2019-10-23 DIAGNOSIS — M546 Pain in thoracic spine: Secondary | ICD-10-CM | POA: Diagnosis not present

## 2019-10-23 DIAGNOSIS — M549 Dorsalgia, unspecified: Secondary | ICD-10-CM | POA: Diagnosis not present

## 2019-10-23 DIAGNOSIS — Z833 Family history of diabetes mellitus: Secondary | ICD-10-CM | POA: Diagnosis not present

## 2019-10-23 DIAGNOSIS — Z905 Acquired absence of kidney: Secondary | ICD-10-CM | POA: Diagnosis not present

## 2019-10-23 DIAGNOSIS — M5134 Other intervertebral disc degeneration, thoracic region: Secondary | ICD-10-CM | POA: Diagnosis not present

## 2019-10-23 DIAGNOSIS — E785 Hyperlipidemia, unspecified: Secondary | ICD-10-CM | POA: Diagnosis not present

## 2019-10-23 DIAGNOSIS — Z7982 Long term (current) use of aspirin: Secondary | ICD-10-CM | POA: Diagnosis not present

## 2019-10-27 DIAGNOSIS — S29012D Strain of muscle and tendon of back wall of thorax, subsequent encounter: Secondary | ICD-10-CM | POA: Diagnosis not present

## 2019-10-27 DIAGNOSIS — M545 Low back pain: Secondary | ICD-10-CM | POA: Diagnosis not present

## 2019-11-04 DIAGNOSIS — M545 Low back pain: Secondary | ICD-10-CM | POA: Diagnosis not present

## 2019-11-04 DIAGNOSIS — S29012D Strain of muscle and tendon of back wall of thorax, subsequent encounter: Secondary | ICD-10-CM | POA: Diagnosis not present

## 2019-11-05 ENCOUNTER — Other Ambulatory Visit: Payer: Self-pay | Admitting: Family Medicine

## 2019-11-05 DIAGNOSIS — F5101 Primary insomnia: Secondary | ICD-10-CM

## 2019-11-06 DIAGNOSIS — S29012D Strain of muscle and tendon of back wall of thorax, subsequent encounter: Secondary | ICD-10-CM | POA: Diagnosis not present

## 2019-11-06 DIAGNOSIS — M545 Low back pain: Secondary | ICD-10-CM | POA: Diagnosis not present

## 2019-11-12 ENCOUNTER — Ambulatory Visit: Payer: Medicare Other | Admitting: Family Medicine

## 2019-11-12 DIAGNOSIS — S29012D Strain of muscle and tendon of back wall of thorax, subsequent encounter: Secondary | ICD-10-CM | POA: Diagnosis not present

## 2019-11-12 DIAGNOSIS — M545 Low back pain: Secondary | ICD-10-CM | POA: Diagnosis not present

## 2019-11-13 DIAGNOSIS — C44329 Squamous cell carcinoma of skin of other parts of face: Secondary | ICD-10-CM | POA: Diagnosis not present

## 2019-11-13 DIAGNOSIS — I480 Paroxysmal atrial fibrillation: Secondary | ICD-10-CM | POA: Diagnosis not present

## 2019-11-13 DIAGNOSIS — R42 Dizziness and giddiness: Secondary | ICD-10-CM | POA: Diagnosis not present

## 2019-11-13 DIAGNOSIS — I714 Abdominal aortic aneurysm, without rupture: Secondary | ICD-10-CM | POA: Diagnosis not present

## 2019-11-13 DIAGNOSIS — I10 Essential (primary) hypertension: Secondary | ICD-10-CM | POA: Diagnosis not present

## 2019-11-13 DIAGNOSIS — M545 Low back pain: Secondary | ICD-10-CM | POA: Diagnosis not present

## 2019-11-13 DIAGNOSIS — I129 Hypertensive chronic kidney disease with stage 1 through stage 4 chronic kidney disease, or unspecified chronic kidney disease: Secondary | ICD-10-CM | POA: Diagnosis not present

## 2019-11-13 DIAGNOSIS — K901 Tropical sprue: Secondary | ICD-10-CM | POA: Diagnosis not present

## 2019-11-13 DIAGNOSIS — R262 Difficulty in walking, not elsewhere classified: Secondary | ICD-10-CM | POA: Diagnosis not present

## 2019-11-13 DIAGNOSIS — I7771 Dissection of carotid artery: Secondary | ICD-10-CM | POA: Diagnosis not present

## 2019-11-13 DIAGNOSIS — E785 Hyperlipidemia, unspecified: Secondary | ICD-10-CM | POA: Diagnosis not present

## 2019-11-13 DIAGNOSIS — R270 Ataxia, unspecified: Secondary | ICD-10-CM | POA: Diagnosis not present

## 2019-11-13 DIAGNOSIS — I700 Atherosclerosis of aorta: Secondary | ICD-10-CM | POA: Diagnosis not present

## 2019-11-13 DIAGNOSIS — I639 Cerebral infarction, unspecified: Secondary | ICD-10-CM | POA: Diagnosis not present

## 2019-11-13 DIAGNOSIS — G459 Transient cerebral ischemic attack, unspecified: Secondary | ICD-10-CM | POA: Diagnosis not present

## 2019-11-13 DIAGNOSIS — D0439 Carcinoma in situ of skin of other parts of face: Secondary | ICD-10-CM | POA: Diagnosis not present

## 2019-11-13 DIAGNOSIS — I671 Cerebral aneurysm, nonruptured: Secondary | ICD-10-CM | POA: Diagnosis not present

## 2019-11-13 DIAGNOSIS — Z8673 Personal history of transient ischemic attack (TIA), and cerebral infarction without residual deficits: Secondary | ICD-10-CM | POA: Diagnosis not present

## 2019-11-13 DIAGNOSIS — J449 Chronic obstructive pulmonary disease, unspecified: Secondary | ICD-10-CM | POA: Diagnosis not present

## 2019-11-13 DIAGNOSIS — K219 Gastro-esophageal reflux disease without esophagitis: Secondary | ICD-10-CM | POA: Diagnosis not present

## 2019-11-13 DIAGNOSIS — R531 Weakness: Secondary | ICD-10-CM | POA: Diagnosis not present

## 2019-11-13 DIAGNOSIS — N179 Acute kidney failure, unspecified: Secondary | ICD-10-CM | POA: Diagnosis not present

## 2019-11-13 DIAGNOSIS — Z20822 Contact with and (suspected) exposure to covid-19: Secondary | ICD-10-CM | POA: Diagnosis not present

## 2019-11-13 DIAGNOSIS — R519 Headache, unspecified: Secondary | ICD-10-CM | POA: Diagnosis not present

## 2019-11-13 DIAGNOSIS — N189 Chronic kidney disease, unspecified: Secondary | ICD-10-CM | POA: Diagnosis not present

## 2019-11-18 DIAGNOSIS — Z8673 Personal history of transient ischemic attack (TIA), and cerebral infarction without residual deficits: Secondary | ICD-10-CM | POA: Diagnosis not present

## 2019-11-18 DIAGNOSIS — M5412 Radiculopathy, cervical region: Secondary | ICD-10-CM | POA: Diagnosis not present

## 2019-11-18 DIAGNOSIS — M545 Low back pain: Secondary | ICD-10-CM | POA: Diagnosis not present

## 2019-11-19 DIAGNOSIS — G459 Transient cerebral ischemic attack, unspecified: Secondary | ICD-10-CM | POA: Diagnosis not present

## 2019-12-02 DIAGNOSIS — L989 Disorder of the skin and subcutaneous tissue, unspecified: Secondary | ICD-10-CM | POA: Diagnosis not present

## 2019-12-02 DIAGNOSIS — L570 Actinic keratosis: Secondary | ICD-10-CM | POA: Diagnosis not present

## 2019-12-02 DIAGNOSIS — Z1283 Encounter for screening for malignant neoplasm of skin: Secondary | ICD-10-CM | POA: Diagnosis not present

## 2019-12-02 DIAGNOSIS — L608 Other nail disorders: Secondary | ICD-10-CM | POA: Diagnosis not present

## 2019-12-16 DIAGNOSIS — G459 Transient cerebral ischemic attack, unspecified: Secondary | ICD-10-CM | POA: Diagnosis not present

## 2019-12-17 DIAGNOSIS — I720 Aneurysm of carotid artery: Secondary | ICD-10-CM | POA: Diagnosis not present

## 2019-12-17 DIAGNOSIS — R42 Dizziness and giddiness: Secondary | ICD-10-CM | POA: Diagnosis not present

## 2019-12-17 DIAGNOSIS — R200 Anesthesia of skin: Secondary | ICD-10-CM | POA: Diagnosis not present

## 2019-12-17 DIAGNOSIS — R531 Weakness: Secondary | ICD-10-CM | POA: Diagnosis not present

## 2019-12-17 DIAGNOSIS — I639 Cerebral infarction, unspecified: Secondary | ICD-10-CM | POA: Diagnosis not present

## 2019-12-17 DIAGNOSIS — R29898 Other symptoms and signs involving the musculoskeletal system: Secondary | ICD-10-CM | POA: Diagnosis not present

## 2019-12-17 DIAGNOSIS — M545 Low back pain: Secondary | ICD-10-CM | POA: Diagnosis not present

## 2019-12-17 DIAGNOSIS — Z9181 History of falling: Secondary | ICD-10-CM | POA: Diagnosis not present

## 2019-12-22 DIAGNOSIS — I480 Paroxysmal atrial fibrillation: Secondary | ICD-10-CM | POA: Diagnosis not present

## 2019-12-22 DIAGNOSIS — K901 Tropical sprue: Secondary | ICD-10-CM | POA: Diagnosis not present

## 2019-12-22 DIAGNOSIS — R569 Unspecified convulsions: Secondary | ICD-10-CM | POA: Diagnosis not present

## 2019-12-22 DIAGNOSIS — R4586 Emotional lability: Secondary | ICD-10-CM | POA: Diagnosis not present

## 2019-12-23 DIAGNOSIS — I471 Supraventricular tachycardia: Secondary | ICD-10-CM | POA: Diagnosis not present

## 2019-12-23 DIAGNOSIS — I440 Atrioventricular block, first degree: Secondary | ICD-10-CM | POA: Diagnosis not present

## 2019-12-25 DIAGNOSIS — I10 Essential (primary) hypertension: Secondary | ICD-10-CM | POA: Diagnosis not present

## 2019-12-25 DIAGNOSIS — Z8673 Personal history of transient ischemic attack (TIA), and cerebral infarction without residual deficits: Secondary | ICD-10-CM | POA: Diagnosis not present

## 2019-12-25 DIAGNOSIS — M545 Low back pain: Secondary | ICD-10-CM | POA: Diagnosis not present

## 2019-12-25 DIAGNOSIS — Z1389 Encounter for screening for other disorder: Secondary | ICD-10-CM | POA: Diagnosis not present

## 2019-12-26 DIAGNOSIS — R448 Other symptoms and signs involving general sensations and perceptions: Secondary | ICD-10-CM | POA: Diagnosis not present

## 2019-12-26 DIAGNOSIS — R269 Unspecified abnormalities of gait and mobility: Secondary | ICD-10-CM | POA: Diagnosis not present

## 2019-12-26 DIAGNOSIS — R200 Anesthesia of skin: Secondary | ICD-10-CM | POA: Diagnosis not present

## 2019-12-26 DIAGNOSIS — Z9181 History of falling: Secondary | ICD-10-CM | POA: Diagnosis not present

## 2019-12-26 DIAGNOSIS — R29898 Other symptoms and signs involving the musculoskeletal system: Secondary | ICD-10-CM | POA: Diagnosis not present

## 2019-12-26 DIAGNOSIS — M545 Low back pain: Secondary | ICD-10-CM | POA: Diagnosis not present

## 2019-12-27 DIAGNOSIS — N189 Chronic kidney disease, unspecified: Secondary | ICD-10-CM | POA: Diagnosis not present

## 2019-12-27 DIAGNOSIS — Z20822 Contact with and (suspected) exposure to covid-19: Secondary | ICD-10-CM | POA: Diagnosis not present

## 2019-12-27 DIAGNOSIS — R918 Other nonspecific abnormal finding of lung field: Secondary | ICD-10-CM | POA: Diagnosis not present

## 2019-12-27 DIAGNOSIS — I129 Hypertensive chronic kidney disease with stage 1 through stage 4 chronic kidney disease, or unspecified chronic kidney disease: Secondary | ICD-10-CM | POA: Diagnosis not present

## 2019-12-27 DIAGNOSIS — R05 Cough: Secondary | ICD-10-CM | POA: Diagnosis not present

## 2019-12-27 DIAGNOSIS — J449 Chronic obstructive pulmonary disease, unspecified: Secondary | ICD-10-CM | POA: Diagnosis not present

## 2019-12-27 DIAGNOSIS — Z7902 Long term (current) use of antithrombotics/antiplatelets: Secondary | ICD-10-CM | POA: Diagnosis not present

## 2019-12-27 DIAGNOSIS — R0602 Shortness of breath: Secondary | ICD-10-CM | POA: Diagnosis not present

## 2019-12-27 DIAGNOSIS — R0789 Other chest pain: Secondary | ICD-10-CM | POA: Diagnosis not present

## 2019-12-27 DIAGNOSIS — R0600 Dyspnea, unspecified: Secondary | ICD-10-CM | POA: Diagnosis not present

## 2019-12-27 DIAGNOSIS — H9190 Unspecified hearing loss, unspecified ear: Secondary | ICD-10-CM | POA: Diagnosis not present

## 2019-12-27 DIAGNOSIS — E785 Hyperlipidemia, unspecified: Secondary | ICD-10-CM | POA: Diagnosis not present

## 2019-12-27 DIAGNOSIS — R079 Chest pain, unspecified: Secondary | ICD-10-CM | POA: Diagnosis not present

## 2019-12-28 DIAGNOSIS — R918 Other nonspecific abnormal finding of lung field: Secondary | ICD-10-CM | POA: Diagnosis not present

## 2019-12-28 DIAGNOSIS — R05 Cough: Secondary | ICD-10-CM | POA: Diagnosis not present

## 2019-12-29 DIAGNOSIS — I671 Cerebral aneurysm, nonruptured: Secondary | ICD-10-CM | POA: Diagnosis not present

## 2019-12-30 DIAGNOSIS — I720 Aneurysm of carotid artery: Secondary | ICD-10-CM | POA: Diagnosis not present

## 2019-12-30 DIAGNOSIS — R220 Localized swelling, mass and lump, head: Secondary | ICD-10-CM | POA: Diagnosis not present

## 2020-01-02 DIAGNOSIS — R29898 Other symptoms and signs involving the musculoskeletal system: Secondary | ICD-10-CM | POA: Diagnosis not present

## 2020-01-02 DIAGNOSIS — R200 Anesthesia of skin: Secondary | ICD-10-CM | POA: Diagnosis not present

## 2020-01-02 DIAGNOSIS — R448 Other symptoms and signs involving general sensations and perceptions: Secondary | ICD-10-CM | POA: Diagnosis not present

## 2020-01-02 DIAGNOSIS — Z9181 History of falling: Secondary | ICD-10-CM | POA: Diagnosis not present

## 2020-01-02 DIAGNOSIS — R269 Unspecified abnormalities of gait and mobility: Secondary | ICD-10-CM | POA: Diagnosis not present

## 2020-01-02 DIAGNOSIS — M545 Low back pain: Secondary | ICD-10-CM | POA: Diagnosis not present

## 2020-01-03 DIAGNOSIS — M545 Low back pain: Secondary | ICD-10-CM | POA: Diagnosis not present

## 2020-01-03 DIAGNOSIS — Z77011 Contact with and (suspected) exposure to lead: Secondary | ICD-10-CM | POA: Diagnosis not present

## 2020-01-03 DIAGNOSIS — Z8673 Personal history of transient ischemic attack (TIA), and cerebral infarction without residual deficits: Secondary | ICD-10-CM | POA: Diagnosis not present

## 2020-01-05 DIAGNOSIS — R448 Other symptoms and signs involving general sensations and perceptions: Secondary | ICD-10-CM | POA: Diagnosis not present

## 2020-01-05 DIAGNOSIS — R269 Unspecified abnormalities of gait and mobility: Secondary | ICD-10-CM | POA: Diagnosis not present

## 2020-01-05 DIAGNOSIS — R200 Anesthesia of skin: Secondary | ICD-10-CM | POA: Diagnosis not present

## 2020-01-05 DIAGNOSIS — R29898 Other symptoms and signs involving the musculoskeletal system: Secondary | ICD-10-CM | POA: Diagnosis not present

## 2020-01-05 DIAGNOSIS — M545 Low back pain: Secondary | ICD-10-CM | POA: Diagnosis not present

## 2020-01-05 DIAGNOSIS — Z9181 History of falling: Secondary | ICD-10-CM | POA: Diagnosis not present

## 2020-01-08 DIAGNOSIS — H57813 Brow ptosis, bilateral: Secondary | ICD-10-CM | POA: Diagnosis not present

## 2020-01-08 DIAGNOSIS — H02532 Eyelid retraction right lower eyelid: Secondary | ICD-10-CM | POA: Diagnosis not present

## 2020-01-08 DIAGNOSIS — H02831 Dermatochalasis of right upper eyelid: Secondary | ICD-10-CM | POA: Diagnosis not present

## 2020-01-08 DIAGNOSIS — H02535 Eyelid retraction left lower eyelid: Secondary | ICD-10-CM | POA: Diagnosis not present

## 2020-01-08 DIAGNOSIS — H16213 Exposure keratoconjunctivitis, bilateral: Secondary | ICD-10-CM | POA: Diagnosis not present

## 2020-01-08 DIAGNOSIS — Z961 Presence of intraocular lens: Secondary | ICD-10-CM | POA: Diagnosis not present

## 2020-01-08 DIAGNOSIS — H02834 Dermatochalasis of left upper eyelid: Secondary | ICD-10-CM | POA: Diagnosis not present

## 2020-01-09 DIAGNOSIS — Z9181 History of falling: Secondary | ICD-10-CM | POA: Diagnosis not present

## 2020-01-09 DIAGNOSIS — R220 Localized swelling, mass and lump, head: Secondary | ICD-10-CM | POA: Diagnosis not present

## 2020-01-09 DIAGNOSIS — I480 Paroxysmal atrial fibrillation: Secondary | ICD-10-CM | POA: Diagnosis not present

## 2020-01-09 DIAGNOSIS — R200 Anesthesia of skin: Secondary | ICD-10-CM | POA: Diagnosis not present

## 2020-01-09 DIAGNOSIS — R269 Unspecified abnormalities of gait and mobility: Secondary | ICD-10-CM | POA: Diagnosis not present

## 2020-01-09 DIAGNOSIS — I129 Hypertensive chronic kidney disease with stage 1 through stage 4 chronic kidney disease, or unspecified chronic kidney disease: Secondary | ICD-10-CM | POA: Diagnosis not present

## 2020-01-09 DIAGNOSIS — Z7902 Long term (current) use of antithrombotics/antiplatelets: Secondary | ICD-10-CM | POA: Diagnosis not present

## 2020-01-09 DIAGNOSIS — J45909 Unspecified asthma, uncomplicated: Secondary | ICD-10-CM | POA: Diagnosis not present

## 2020-01-09 DIAGNOSIS — M545 Low back pain: Secondary | ICD-10-CM | POA: Diagnosis not present

## 2020-01-09 DIAGNOSIS — K149 Disease of tongue, unspecified: Secondary | ICD-10-CM | POA: Diagnosis not present

## 2020-01-09 DIAGNOSIS — R448 Other symptoms and signs involving general sensations and perceptions: Secondary | ICD-10-CM | POA: Diagnosis not present

## 2020-01-09 DIAGNOSIS — N189 Chronic kidney disease, unspecified: Secondary | ICD-10-CM | POA: Diagnosis not present

## 2020-01-09 DIAGNOSIS — Z8673 Personal history of transient ischemic attack (TIA), and cerebral infarction without residual deficits: Secondary | ICD-10-CM | POA: Diagnosis not present

## 2020-01-09 DIAGNOSIS — I720 Aneurysm of carotid artery: Secondary | ICD-10-CM | POA: Diagnosis not present

## 2020-01-09 DIAGNOSIS — R29898 Other symptoms and signs involving the musculoskeletal system: Secondary | ICD-10-CM | POA: Diagnosis not present

## 2020-01-09 DIAGNOSIS — Z882 Allergy status to sulfonamides status: Secondary | ICD-10-CM | POA: Diagnosis not present

## 2020-01-14 DIAGNOSIS — R29898 Other symptoms and signs involving the musculoskeletal system: Secondary | ICD-10-CM | POA: Diagnosis not present

## 2020-01-14 DIAGNOSIS — M545 Low back pain: Secondary | ICD-10-CM | POA: Diagnosis not present

## 2020-01-14 DIAGNOSIS — R200 Anesthesia of skin: Secondary | ICD-10-CM | POA: Diagnosis not present

## 2020-01-14 DIAGNOSIS — R269 Unspecified abnormalities of gait and mobility: Secondary | ICD-10-CM | POA: Diagnosis not present

## 2020-01-14 DIAGNOSIS — R448 Other symptoms and signs involving general sensations and perceptions: Secondary | ICD-10-CM | POA: Diagnosis not present

## 2020-01-14 DIAGNOSIS — Z9181 History of falling: Secondary | ICD-10-CM | POA: Diagnosis not present

## 2020-01-16 DIAGNOSIS — R448 Other symptoms and signs involving general sensations and perceptions: Secondary | ICD-10-CM | POA: Diagnosis not present

## 2020-01-16 DIAGNOSIS — M545 Low back pain: Secondary | ICD-10-CM | POA: Diagnosis not present

## 2020-01-16 DIAGNOSIS — R29898 Other symptoms and signs involving the musculoskeletal system: Secondary | ICD-10-CM | POA: Diagnosis not present

## 2020-01-16 DIAGNOSIS — Z9181 History of falling: Secondary | ICD-10-CM | POA: Diagnosis not present

## 2020-01-16 DIAGNOSIS — R269 Unspecified abnormalities of gait and mobility: Secondary | ICD-10-CM | POA: Diagnosis not present

## 2020-01-16 DIAGNOSIS — R200 Anesthesia of skin: Secondary | ICD-10-CM | POA: Diagnosis not present

## 2020-01-20 DIAGNOSIS — L820 Inflamed seborrheic keratosis: Secondary | ICD-10-CM | POA: Diagnosis not present

## 2020-01-20 DIAGNOSIS — D492 Neoplasm of unspecified behavior of bone, soft tissue, and skin: Secondary | ICD-10-CM | POA: Diagnosis not present

## 2020-01-20 DIAGNOSIS — C44311 Basal cell carcinoma of skin of nose: Secondary | ICD-10-CM | POA: Diagnosis not present

## 2020-01-20 DIAGNOSIS — L608 Other nail disorders: Secondary | ICD-10-CM | POA: Diagnosis not present

## 2020-01-21 DIAGNOSIS — R269 Unspecified abnormalities of gait and mobility: Secondary | ICD-10-CM | POA: Diagnosis not present

## 2020-01-21 DIAGNOSIS — R449 Unspecified symptoms and signs involving general sensations and perceptions: Secondary | ICD-10-CM | POA: Diagnosis not present

## 2020-01-21 DIAGNOSIS — R29898 Other symptoms and signs involving the musculoskeletal system: Secondary | ICD-10-CM | POA: Diagnosis not present

## 2020-01-21 DIAGNOSIS — M545 Low back pain: Secondary | ICD-10-CM | POA: Diagnosis not present

## 2020-01-22 DIAGNOSIS — M5116 Intervertebral disc disorders with radiculopathy, lumbar region: Secondary | ICD-10-CM | POA: Diagnosis not present

## 2020-01-22 DIAGNOSIS — M5441 Lumbago with sciatica, right side: Secondary | ICD-10-CM | POA: Diagnosis not present

## 2020-01-22 DIAGNOSIS — G8929 Other chronic pain: Secondary | ICD-10-CM | POA: Diagnosis not present

## 2020-01-22 DIAGNOSIS — Z8673 Personal history of transient ischemic attack (TIA), and cerebral infarction without residual deficits: Secondary | ICD-10-CM | POA: Diagnosis not present

## 2020-01-22 DIAGNOSIS — F329 Major depressive disorder, single episode, unspecified: Secondary | ICD-10-CM | POA: Diagnosis not present

## 2020-01-22 DIAGNOSIS — J45909 Unspecified asthma, uncomplicated: Secondary | ICD-10-CM | POA: Diagnosis not present

## 2020-01-22 DIAGNOSIS — M5442 Lumbago with sciatica, left side: Secondary | ICD-10-CM | POA: Diagnosis not present

## 2020-01-22 DIAGNOSIS — M5136 Other intervertebral disc degeneration, lumbar region: Secondary | ICD-10-CM | POA: Diagnosis not present

## 2020-01-22 DIAGNOSIS — E785 Hyperlipidemia, unspecified: Secondary | ICD-10-CM | POA: Diagnosis not present

## 2020-01-22 DIAGNOSIS — N189 Chronic kidney disease, unspecified: Secondary | ICD-10-CM | POA: Diagnosis not present

## 2020-01-22 DIAGNOSIS — Z955 Presence of coronary angioplasty implant and graft: Secondary | ICD-10-CM | POA: Diagnosis not present

## 2020-01-22 DIAGNOSIS — I129 Hypertensive chronic kidney disease with stage 1 through stage 4 chronic kidney disease, or unspecified chronic kidney disease: Secondary | ICD-10-CM | POA: Diagnosis not present

## 2020-01-23 DIAGNOSIS — J189 Pneumonia, unspecified organism: Secondary | ICD-10-CM | POA: Diagnosis not present

## 2020-01-23 DIAGNOSIS — R05 Cough: Secondary | ICD-10-CM | POA: Diagnosis not present

## 2020-01-23 DIAGNOSIS — R918 Other nonspecific abnormal finding of lung field: Secondary | ICD-10-CM | POA: Diagnosis not present

## 2020-01-28 DIAGNOSIS — R449 Unspecified symptoms and signs involving general sensations and perceptions: Secondary | ICD-10-CM | POA: Diagnosis not present

## 2020-01-28 DIAGNOSIS — M545 Low back pain: Secondary | ICD-10-CM | POA: Diagnosis not present

## 2020-01-28 DIAGNOSIS — R29898 Other symptoms and signs involving the musculoskeletal system: Secondary | ICD-10-CM | POA: Diagnosis not present

## 2020-01-28 DIAGNOSIS — Z9181 History of falling: Secondary | ICD-10-CM | POA: Diagnosis not present

## 2020-01-28 DIAGNOSIS — R2689 Other abnormalities of gait and mobility: Secondary | ICD-10-CM | POA: Diagnosis not present

## 2020-01-28 DIAGNOSIS — R293 Abnormal posture: Secondary | ICD-10-CM | POA: Diagnosis not present

## 2020-01-28 DIAGNOSIS — G8929 Other chronic pain: Secondary | ICD-10-CM | POA: Diagnosis not present

## 2020-01-31 DIAGNOSIS — R0602 Shortness of breath: Secondary | ICD-10-CM | POA: Diagnosis not present

## 2020-01-31 DIAGNOSIS — I129 Hypertensive chronic kidney disease with stage 1 through stage 4 chronic kidney disease, or unspecified chronic kidney disease: Secondary | ICD-10-CM | POA: Diagnosis not present

## 2020-01-31 DIAGNOSIS — R0600 Dyspnea, unspecified: Secondary | ICD-10-CM | POA: Diagnosis not present

## 2020-01-31 DIAGNOSIS — J449 Chronic obstructive pulmonary disease, unspecified: Secondary | ICD-10-CM | POA: Diagnosis not present

## 2020-01-31 DIAGNOSIS — Z20822 Contact with and (suspected) exposure to covid-19: Secondary | ICD-10-CM | POA: Diagnosis not present

## 2020-01-31 DIAGNOSIS — R05 Cough: Secondary | ICD-10-CM | POA: Diagnosis not present

## 2020-01-31 DIAGNOSIS — H9190 Unspecified hearing loss, unspecified ear: Secondary | ICD-10-CM | POA: Diagnosis not present

## 2020-01-31 DIAGNOSIS — J9811 Atelectasis: Secondary | ICD-10-CM | POA: Diagnosis not present

## 2020-01-31 DIAGNOSIS — R600 Localized edema: Secondary | ICD-10-CM | POA: Diagnosis not present

## 2020-01-31 DIAGNOSIS — R062 Wheezing: Secondary | ICD-10-CM | POA: Diagnosis not present

## 2020-01-31 DIAGNOSIS — Z7952 Long term (current) use of systemic steroids: Secondary | ICD-10-CM | POA: Diagnosis not present

## 2020-02-06 ENCOUNTER — Other Ambulatory Visit: Payer: Self-pay | Admitting: Family Medicine

## 2020-02-06 DIAGNOSIS — R0602 Shortness of breath: Secondary | ICD-10-CM | POA: Diagnosis not present

## 2020-02-06 DIAGNOSIS — I10 Essential (primary) hypertension: Secondary | ICD-10-CM

## 2020-02-06 NOTE — Telephone Encounter (Signed)
Requested Prescriptions  Pending Prescriptions Disp Refills   irbesartan-hydrochlorothiazide (AVALIDE) 300-12.5 MG tablet [Pharmacy Med Name: Irbesartan-hydroCHLOROthiazide 300-12.5 MG Oral Tablet] 90 tablet 0    Sig: Take 1 tablet by mouth once daily     Cardiovascular: ARB + Diuretic Combos Failed - 02/06/2020  8:49 AM      Failed - Cr in normal range and within 180 days    Creat  Date Value Ref Range Status  12/24/2018 1.10 0.70 - 1.11 mg/dL Final    Comment:    For patients >22 years of age, the reference limit for Creatinine is approximately 13% higher for people identified as African-American. .    Creatinine, Ser  Date Value Ref Range Status  10/18/2019 1.33 (H) 0.61 - 1.24 mg/dL Final         Failed - Last BP in normal range    BP Readings from Last 1 Encounters:  10/18/19 (!) 171/87         Passed - K in normal range and within 180 days    Potassium  Date Value Ref Range Status  10/18/2019 4.1 3.5 - 5.1 mmol/L Final         Passed - Na in normal range and within 180 days    Sodium  Date Value Ref Range Status  10/18/2019 137 135 - 145 mmol/L Final         Passed - Ca in normal range and within 180 days    Calcium  Date Value Ref Range Status  10/18/2019 9.2 8.9 - 10.3 mg/dL Final         Passed - Patient is not pregnant      Passed - Valid encounter within last 6 months    Recent Outpatient Visits          5 months ago Hospital discharge follow-up   [facility] Steele Sizer, MD   1 year ago SMA stenosis [facility])   [facility] Steele Sizer, MD   1 year ago SMA stenosis [facility])   [facility] Steele Sizer, MD   1 year ago Acquired pericardial cyst   [facility] Steele Sizer, MD   1 year ago Abnormal chest x-ray   [facility] Steele Sizer, MD

## 2020-02-09 DIAGNOSIS — G8929 Other chronic pain: Secondary | ICD-10-CM | POA: Diagnosis not present

## 2020-02-09 DIAGNOSIS — I714 Abdominal aortic aneurysm, without rupture: Secondary | ICD-10-CM | POA: Diagnosis not present

## 2020-02-09 DIAGNOSIS — M5441 Lumbago with sciatica, right side: Secondary | ICD-10-CM | POA: Diagnosis not present

## 2020-02-09 DIAGNOSIS — M48061 Spinal stenosis, lumbar region without neurogenic claudication: Secondary | ICD-10-CM | POA: Diagnosis not present

## 2020-02-09 DIAGNOSIS — M5442 Lumbago with sciatica, left side: Secondary | ICD-10-CM | POA: Diagnosis not present

## 2020-02-11 DIAGNOSIS — R0602 Shortness of breath: Secondary | ICD-10-CM | POA: Diagnosis not present

## 2020-02-11 DIAGNOSIS — R059 Cough, unspecified: Secondary | ICD-10-CM | POA: Diagnosis not present

## 2020-02-12 DIAGNOSIS — R059 Cough, unspecified: Secondary | ICD-10-CM | POA: Diagnosis not present

## 2020-02-13 DIAGNOSIS — Q248 Other specified congenital malformations of heart: Secondary | ICD-10-CM | POA: Diagnosis not present

## 2020-02-13 DIAGNOSIS — R059 Cough, unspecified: Secondary | ICD-10-CM | POA: Diagnosis not present

## 2020-02-13 DIAGNOSIS — R0602 Shortness of breath: Secondary | ICD-10-CM | POA: Diagnosis not present

## 2020-02-14 ENCOUNTER — Other Ambulatory Visit: Payer: Self-pay | Admitting: Family Medicine

## 2020-02-14 DIAGNOSIS — F5101 Primary insomnia: Secondary | ICD-10-CM

## 2020-02-14 DIAGNOSIS — I10 Essential (primary) hypertension: Secondary | ICD-10-CM

## 2020-02-17 ENCOUNTER — Other Ambulatory Visit: Payer: Self-pay | Admitting: Family Medicine

## 2020-02-17 DIAGNOSIS — K551 Chronic vascular disorders of intestine: Secondary | ICD-10-CM

## 2020-02-17 DIAGNOSIS — K269 Duodenal ulcer, unspecified as acute or chronic, without hemorrhage or perforation: Secondary | ICD-10-CM

## 2020-02-17 DIAGNOSIS — K219 Gastro-esophageal reflux disease without esophagitis: Secondary | ICD-10-CM

## 2020-02-18 NOTE — Telephone Encounter (Signed)
Please forward not a pt of [facility].

## 2020-02-19 DIAGNOSIS — M19011 Primary osteoarthritis, right shoulder: Secondary | ICD-10-CM | POA: Diagnosis not present

## 2020-02-19 DIAGNOSIS — M19019 Primary osteoarthritis, unspecified shoulder: Secondary | ICD-10-CM | POA: Diagnosis not present

## 2020-02-19 DIAGNOSIS — M7581 Other shoulder lesions, right shoulder: Secondary | ICD-10-CM | POA: Diagnosis not present

## 2020-02-19 DIAGNOSIS — M7591 Shoulder lesion, unspecified, right shoulder: Secondary | ICD-10-CM | POA: Diagnosis not present

## 2020-02-19 DIAGNOSIS — S4991XA Unspecified injury of right shoulder and upper arm, initial encounter: Secondary | ICD-10-CM | POA: Diagnosis not present

## 2020-02-26 DIAGNOSIS — M5442 Lumbago with sciatica, left side: Secondary | ICD-10-CM | POA: Diagnosis not present

## 2020-02-26 DIAGNOSIS — M25511 Pain in right shoulder: Secondary | ICD-10-CM | POA: Diagnosis not present

## 2020-02-26 DIAGNOSIS — M5441 Lumbago with sciatica, right side: Secondary | ICD-10-CM | POA: Diagnosis not present

## 2020-02-26 DIAGNOSIS — G8929 Other chronic pain: Secondary | ICD-10-CM | POA: Diagnosis not present

## 2020-03-07 ENCOUNTER — Other Ambulatory Visit: Payer: Self-pay | Admitting: Family Medicine

## 2020-03-07 DIAGNOSIS — F5101 Primary insomnia: Secondary | ICD-10-CM

## 2020-03-08 ENCOUNTER — Other Ambulatory Visit: Payer: Self-pay | Admitting: Family Medicine

## 2020-03-08 DIAGNOSIS — F5101 Primary insomnia: Secondary | ICD-10-CM

## 2020-03-08 NOTE — Telephone Encounter (Signed)
Requested medication (s) are due for refill today: yes   Requested medication (s) are on the active medication list: yes  Last refill:  11/05/19 #90 0 refills   Future visit scheduled: no   Notes to clinic:  no valid encounter in 6 months, do you want 30 day refill?      Requested Prescriptions  Pending Prescriptions Disp Refills   traZODone (DESYREL) 50 MG tablet [Pharmacy Med Name: traZODone HCl 50 MG Oral Tablet] 90 tablet 0    Sig: TAKE 1 TABLET BY MOUTH AT BEDTIME      Psychiatry: Antidepressants - Serotonin Modulator Failed - 03/08/2020 10:40 AM      Failed - Valid encounter within last 6 months    Recent Outpatient Visits           6 months ago Hospital discharge follow-up   [facility] Steele Sizer, MD   1 year ago SMA stenosis [facility])   [facility] Steele Sizer, MD   1 year ago SMA stenosis [facility])   [facility] Steele Sizer, MD   1 year ago Acquired pericardial cyst   [facility] Steele Sizer, MD   1 year ago Abnormal chest x-ray   [facility] Steele Sizer, MD               Signed Prescriptions Disp Refills   clopidogrel (PLAVIX) 75 MG tablet        There is no refill protocol information for this order      lidocaine (LIDODERM) 5 %      Sig: 1 patch daily.      There is no refill protocol information for this order      methylPREDNISolone (MEDROL DOSEPAK) 4 MG TBPK tablet      Sig: See admin instructions.      There is no refill protocol information for this order      tiZANidine (ZANAFLEX) 4 MG tablet      Sig: TAKE 1 TABLET BY MOUTH THREE TIMES DAILY AS NEEDED FOR MUSCLE PAIN      There is no refill protocol information for this order      atorvastatin (LIPITOR) 10 MG tablet      Sig: Take by mouth.      There is no refill protocol information for this order      loperamide (IMODIUM) 2 MG capsule        There is no refill protocol  information for this order

## 2020-03-12 DIAGNOSIS — R0602 Shortness of breath: Secondary | ICD-10-CM | POA: Diagnosis not present

## 2020-03-12 DIAGNOSIS — G459 Transient cerebral ischemic attack, unspecified: Secondary | ICD-10-CM | POA: Diagnosis not present

## 2020-03-12 DIAGNOSIS — I714 Abdominal aortic aneurysm, without rupture: Secondary | ICD-10-CM | POA: Diagnosis not present

## 2020-03-12 DIAGNOSIS — I200 Unstable angina: Secondary | ICD-10-CM | POA: Diagnosis not present

## 2020-03-12 DIAGNOSIS — Q248 Other specified congenital malformations of heart: Secondary | ICD-10-CM | POA: Diagnosis not present

## 2020-03-12 DIAGNOSIS — R0789 Other chest pain: Secondary | ICD-10-CM | POA: Diagnosis not present

## 2020-03-15 DIAGNOSIS — H02403 Unspecified ptosis of bilateral eyelids: Secondary | ICD-10-CM | POA: Diagnosis not present

## 2020-03-15 DIAGNOSIS — I739 Peripheral vascular disease, unspecified: Secondary | ICD-10-CM | POA: Diagnosis not present

## 2020-03-15 DIAGNOSIS — H57813 Brow ptosis, bilateral: Secondary | ICD-10-CM | POA: Diagnosis not present

## 2020-03-15 DIAGNOSIS — H02535 Eyelid retraction left lower eyelid: Secondary | ICD-10-CM | POA: Diagnosis not present

## 2020-03-15 DIAGNOSIS — Z961 Presence of intraocular lens: Secondary | ICD-10-CM | POA: Diagnosis not present

## 2020-03-15 DIAGNOSIS — H02834 Dermatochalasis of left upper eyelid: Secondary | ICD-10-CM | POA: Diagnosis not present

## 2020-03-15 DIAGNOSIS — H02831 Dermatochalasis of right upper eyelid: Secondary | ICD-10-CM | POA: Diagnosis not present

## 2020-03-15 DIAGNOSIS — H9190 Unspecified hearing loss, unspecified ear: Secondary | ICD-10-CM | POA: Diagnosis not present

## 2020-03-15 DIAGNOSIS — H02532 Eyelid retraction right lower eyelid: Secondary | ICD-10-CM | POA: Diagnosis not present

## 2020-03-15 DIAGNOSIS — Z20828 Contact with and (suspected) exposure to other viral communicable diseases: Secondary | ICD-10-CM | POA: Diagnosis not present

## 2020-03-23 DIAGNOSIS — H02831 Dermatochalasis of right upper eyelid: Secondary | ICD-10-CM | POA: Diagnosis not present

## 2020-03-23 DIAGNOSIS — H02834 Dermatochalasis of left upper eyelid: Secondary | ICD-10-CM | POA: Diagnosis not present

## 2020-03-23 DIAGNOSIS — H57813 Brow ptosis, bilateral: Secondary | ICD-10-CM | POA: Diagnosis not present

## 2020-03-23 DIAGNOSIS — L814 Other melanin hyperpigmentation: Secondary | ICD-10-CM | POA: Diagnosis not present

## 2020-03-23 DIAGNOSIS — Z85828 Personal history of other malignant neoplasm of skin: Secondary | ICD-10-CM | POA: Diagnosis not present

## 2020-03-23 DIAGNOSIS — C44311 Basal cell carcinoma of skin of nose: Secondary | ICD-10-CM | POA: Diagnosis not present

## 2020-03-23 DIAGNOSIS — L578 Other skin changes due to chronic exposure to nonionizing radiation: Secondary | ICD-10-CM | POA: Diagnosis not present

## 2020-03-24 DIAGNOSIS — R0600 Dyspnea, unspecified: Secondary | ICD-10-CM | POA: Diagnosis not present

## 2020-03-24 DIAGNOSIS — Q248 Other specified congenital malformations of heart: Secondary | ICD-10-CM | POA: Diagnosis not present

## 2020-03-24 DIAGNOSIS — I200 Unstable angina: Secondary | ICD-10-CM | POA: Diagnosis not present

## 2020-03-25 DIAGNOSIS — M48061 Spinal stenosis, lumbar region without neurogenic claudication: Secondary | ICD-10-CM | POA: Diagnosis not present

## 2020-04-02 ENCOUNTER — Other Ambulatory Visit: Payer: Self-pay | Admitting: Family Medicine

## 2020-04-02 DIAGNOSIS — I10 Essential (primary) hypertension: Secondary | ICD-10-CM

## 2020-04-09 DIAGNOSIS — Z905 Acquired absence of kidney: Secondary | ICD-10-CM | POA: Diagnosis not present

## 2020-04-09 DIAGNOSIS — I714 Abdominal aortic aneurysm, without rupture: Secondary | ICD-10-CM | POA: Diagnosis not present

## 2020-04-09 DIAGNOSIS — J9859 Other diseases of mediastinum, not elsewhere classified: Secondary | ICD-10-CM | POA: Diagnosis not present

## 2020-04-09 DIAGNOSIS — Z66 Do not resuscitate: Secondary | ICD-10-CM | POA: Diagnosis not present

## 2020-04-09 DIAGNOSIS — I480 Paroxysmal atrial fibrillation: Secondary | ICD-10-CM | POA: Diagnosis not present

## 2020-04-09 DIAGNOSIS — I720 Aneurysm of carotid artery: Secondary | ICD-10-CM | POA: Diagnosis not present

## 2020-04-09 DIAGNOSIS — C44300 Unspecified malignant neoplasm of skin of unspecified part of face: Secondary | ICD-10-CM | POA: Diagnosis not present

## 2020-04-09 DIAGNOSIS — R220 Localized swelling, mass and lump, head: Secondary | ICD-10-CM | POA: Diagnosis not present

## 2020-04-09 DIAGNOSIS — I7090 Unspecified atherosclerosis: Secondary | ICD-10-CM | POA: Diagnosis not present

## 2020-04-12 DIAGNOSIS — I472 Ventricular tachycardia: Secondary | ICD-10-CM | POA: Diagnosis not present

## 2020-04-12 DIAGNOSIS — R0602 Shortness of breath: Secondary | ICD-10-CM | POA: Diagnosis not present

## 2020-04-12 DIAGNOSIS — I4891 Unspecified atrial fibrillation: Secondary | ICD-10-CM | POA: Diagnosis not present

## 2020-04-12 DIAGNOSIS — R1084 Generalized abdominal pain: Secondary | ICD-10-CM | POA: Diagnosis not present

## 2020-04-12 DIAGNOSIS — R109 Unspecified abdominal pain: Secondary | ICD-10-CM | POA: Diagnosis not present

## 2020-04-12 DIAGNOSIS — I129 Hypertensive chronic kidney disease with stage 1 through stage 4 chronic kidney disease, or unspecified chronic kidney disease: Secondary | ICD-10-CM | POA: Diagnosis not present

## 2020-04-12 DIAGNOSIS — J9811 Atelectasis: Secondary | ICD-10-CM | POA: Diagnosis not present

## 2020-04-12 DIAGNOSIS — G479 Sleep disorder, unspecified: Secondary | ICD-10-CM | POA: Diagnosis not present

## 2020-04-12 DIAGNOSIS — R0789 Other chest pain: Secondary | ICD-10-CM | POA: Diagnosis not present

## 2020-04-12 DIAGNOSIS — I714 Abdominal aortic aneurysm, without rupture: Secondary | ICD-10-CM | POA: Diagnosis not present

## 2020-04-12 DIAGNOSIS — Z66 Do not resuscitate: Secondary | ICD-10-CM | POA: Diagnosis not present

## 2020-04-12 DIAGNOSIS — R9431 Abnormal electrocardiogram [ECG] [EKG]: Secondary | ICD-10-CM | POA: Diagnosis not present

## 2020-04-12 DIAGNOSIS — Z20822 Contact with and (suspected) exposure to covid-19: Secondary | ICD-10-CM | POA: Diagnosis not present

## 2020-04-12 DIAGNOSIS — I4510 Unspecified right bundle-branch block: Secondary | ICD-10-CM | POA: Diagnosis not present

## 2020-04-12 DIAGNOSIS — R197 Diarrhea, unspecified: Secondary | ICD-10-CM | POA: Diagnosis not present

## 2020-04-12 DIAGNOSIS — G8929 Other chronic pain: Secondary | ICD-10-CM | POA: Diagnosis not present

## 2020-04-12 DIAGNOSIS — N189 Chronic kidney disease, unspecified: Secondary | ICD-10-CM | POA: Diagnosis not present

## 2020-04-12 DIAGNOSIS — J449 Chronic obstructive pulmonary disease, unspecified: Secondary | ICD-10-CM | POA: Diagnosis not present

## 2020-04-12 DIAGNOSIS — F339 Major depressive disorder, recurrent, unspecified: Secondary | ICD-10-CM | POA: Diagnosis not present

## 2020-04-12 DIAGNOSIS — I2510 Atherosclerotic heart disease of native coronary artery without angina pectoris: Secondary | ICD-10-CM | POA: Diagnosis not present

## 2020-04-12 DIAGNOSIS — I10 Essential (primary) hypertension: Secondary | ICD-10-CM | POA: Diagnosis not present

## 2020-04-13 DIAGNOSIS — F339 Major depressive disorder, recurrent, unspecified: Secondary | ICD-10-CM | POA: Diagnosis not present

## 2020-04-13 DIAGNOSIS — G8929 Other chronic pain: Secondary | ICD-10-CM | POA: Diagnosis not present

## 2020-04-13 DIAGNOSIS — R109 Unspecified abdominal pain: Secondary | ICD-10-CM | POA: Diagnosis not present

## 2020-04-13 DIAGNOSIS — I10 Essential (primary) hypertension: Secondary | ICD-10-CM | POA: Diagnosis not present

## 2020-04-13 DIAGNOSIS — I4891 Unspecified atrial fibrillation: Secondary | ICD-10-CM | POA: Diagnosis not present

## 2020-04-13 DIAGNOSIS — R0789 Other chest pain: Secondary | ICD-10-CM | POA: Diagnosis not present

## 2020-04-13 DIAGNOSIS — I714 Abdominal aortic aneurysm, without rupture: Secondary | ICD-10-CM | POA: Diagnosis not present

## 2020-04-14 DIAGNOSIS — K551 Chronic vascular disorders of intestine: Secondary | ICD-10-CM | POA: Diagnosis not present

## 2020-04-14 DIAGNOSIS — R079 Chest pain, unspecified: Secondary | ICD-10-CM | POA: Diagnosis not present

## 2020-04-14 DIAGNOSIS — I129 Hypertensive chronic kidney disease with stage 1 through stage 4 chronic kidney disease, or unspecified chronic kidney disease: Secondary | ICD-10-CM | POA: Diagnosis not present

## 2020-04-14 DIAGNOSIS — R1013 Epigastric pain: Secondary | ICD-10-CM | POA: Diagnosis not present

## 2020-04-14 DIAGNOSIS — I714 Abdominal aortic aneurysm, without rupture: Secondary | ICD-10-CM | POA: Diagnosis not present

## 2020-04-14 DIAGNOSIS — R0789 Other chest pain: Secondary | ICD-10-CM | POA: Diagnosis not present

## 2020-04-14 DIAGNOSIS — R110 Nausea: Secondary | ICD-10-CM | POA: Diagnosis not present

## 2020-04-14 DIAGNOSIS — I4891 Unspecified atrial fibrillation: Secondary | ICD-10-CM | POA: Diagnosis not present

## 2020-04-14 DIAGNOSIS — I739 Peripheral vascular disease, unspecified: Secondary | ICD-10-CM | POA: Diagnosis not present

## 2020-04-14 DIAGNOSIS — I480 Paroxysmal atrial fibrillation: Secondary | ICD-10-CM | POA: Diagnosis not present

## 2020-04-14 DIAGNOSIS — F339 Major depressive disorder, recurrent, unspecified: Secondary | ICD-10-CM | POA: Diagnosis not present

## 2020-04-14 DIAGNOSIS — I2510 Atherosclerotic heart disease of native coronary artery without angina pectoris: Secondary | ICD-10-CM | POA: Diagnosis not present

## 2020-04-14 DIAGNOSIS — G8929 Other chronic pain: Secondary | ICD-10-CM | POA: Diagnosis not present

## 2020-04-14 DIAGNOSIS — I10 Essential (primary) hypertension: Secondary | ICD-10-CM | POA: Diagnosis not present

## 2020-04-14 DIAGNOSIS — J449 Chronic obstructive pulmonary disease, unspecified: Secondary | ICD-10-CM | POA: Diagnosis not present

## 2020-04-14 DIAGNOSIS — N189 Chronic kidney disease, unspecified: Secondary | ICD-10-CM | POA: Diagnosis not present

## 2020-04-15 DIAGNOSIS — I739 Peripheral vascular disease, unspecified: Secondary | ICD-10-CM | POA: Diagnosis not present

## 2020-04-15 DIAGNOSIS — I714 Abdominal aortic aneurysm, without rupture: Secondary | ICD-10-CM | POA: Diagnosis not present

## 2020-04-15 DIAGNOSIS — R9430 Abnormal result of cardiovascular function study, unspecified: Secondary | ICD-10-CM | POA: Diagnosis not present

## 2020-04-15 DIAGNOSIS — N189 Chronic kidney disease, unspecified: Secondary | ICD-10-CM | POA: Diagnosis not present

## 2020-04-15 DIAGNOSIS — I4891 Unspecified atrial fibrillation: Secondary | ICD-10-CM | POA: Diagnosis not present

## 2020-04-15 DIAGNOSIS — I129 Hypertensive chronic kidney disease with stage 1 through stage 4 chronic kidney disease, or unspecified chronic kidney disease: Secondary | ICD-10-CM | POA: Diagnosis not present

## 2020-04-15 DIAGNOSIS — I480 Paroxysmal atrial fibrillation: Secondary | ICD-10-CM | POA: Diagnosis not present

## 2020-04-15 DIAGNOSIS — I318 Other specified diseases of pericardium: Secondary | ICD-10-CM | POA: Diagnosis not present

## 2020-04-15 DIAGNOSIS — F339 Major depressive disorder, recurrent, unspecified: Secondary | ICD-10-CM | POA: Diagnosis not present

## 2020-04-15 DIAGNOSIS — R079 Chest pain, unspecified: Secondary | ICD-10-CM | POA: Diagnosis not present

## 2020-04-15 DIAGNOSIS — G8929 Other chronic pain: Secondary | ICD-10-CM | POA: Diagnosis not present

## 2020-04-15 DIAGNOSIS — I10 Essential (primary) hypertension: Secondary | ICD-10-CM | POA: Diagnosis not present

## 2020-04-15 DIAGNOSIS — R0789 Other chest pain: Secondary | ICD-10-CM | POA: Diagnosis not present

## 2020-04-16 DIAGNOSIS — Q248 Other specified congenital malformations of heart: Secondary | ICD-10-CM | POA: Diagnosis not present

## 2020-04-16 DIAGNOSIS — I318 Other specified diseases of pericardium: Secondary | ICD-10-CM | POA: Diagnosis not present

## 2020-04-16 DIAGNOSIS — Z8673 Personal history of transient ischemic attack (TIA), and cerebral infarction without residual deficits: Secondary | ICD-10-CM | POA: Diagnosis not present

## 2020-04-16 DIAGNOSIS — Z882 Allergy status to sulfonamides status: Secondary | ICD-10-CM | POA: Diagnosis not present

## 2020-04-16 DIAGNOSIS — Z791 Long term (current) use of non-steroidal anti-inflammatories (NSAID): Secondary | ICD-10-CM | POA: Diagnosis not present

## 2020-04-16 DIAGNOSIS — R059 Cough, unspecified: Secondary | ICD-10-CM | POA: Diagnosis not present

## 2020-04-16 DIAGNOSIS — R9439 Abnormal result of other cardiovascular function study: Secondary | ICD-10-CM | POA: Diagnosis not present

## 2020-04-16 DIAGNOSIS — N189 Chronic kidney disease, unspecified: Secondary | ICD-10-CM | POA: Diagnosis not present

## 2020-04-16 DIAGNOSIS — M1990 Unspecified osteoarthritis, unspecified site: Secondary | ICD-10-CM | POA: Diagnosis not present

## 2020-04-16 DIAGNOSIS — R079 Chest pain, unspecified: Secondary | ICD-10-CM | POA: Diagnosis not present

## 2020-04-16 DIAGNOSIS — H9190 Unspecified hearing loss, unspecified ear: Secondary | ICD-10-CM | POA: Diagnosis not present

## 2020-04-16 DIAGNOSIS — I200 Unstable angina: Secondary | ICD-10-CM | POA: Diagnosis not present

## 2020-04-16 DIAGNOSIS — I10 Essential (primary) hypertension: Secondary | ICD-10-CM | POA: Diagnosis not present

## 2020-04-16 DIAGNOSIS — K279 Peptic ulcer, site unspecified, unspecified as acute or chronic, without hemorrhage or perforation: Secondary | ICD-10-CM | POA: Diagnosis not present

## 2020-04-16 DIAGNOSIS — I129 Hypertensive chronic kidney disease with stage 1 through stage 4 chronic kidney disease, or unspecified chronic kidney disease: Secondary | ICD-10-CM | POA: Diagnosis not present

## 2020-04-16 DIAGNOSIS — R634 Abnormal weight loss: Secondary | ICD-10-CM | POA: Diagnosis not present

## 2020-04-19 DIAGNOSIS — Z09 Encounter for follow-up examination after completed treatment for conditions other than malignant neoplasm: Secondary | ICD-10-CM | POA: Diagnosis not present

## 2020-04-19 DIAGNOSIS — R079 Chest pain, unspecified: Secondary | ICD-10-CM | POA: Diagnosis not present

## 2020-04-19 DIAGNOSIS — G479 Sleep disorder, unspecified: Secondary | ICD-10-CM | POA: Diagnosis not present

## 2020-04-19 DIAGNOSIS — Q248 Other specified congenital malformations of heart: Secondary | ICD-10-CM | POA: Diagnosis not present

## 2020-04-20 DIAGNOSIS — R059 Cough, unspecified: Secondary | ICD-10-CM | POA: Diagnosis not present

## 2020-04-22 DIAGNOSIS — M48061 Spinal stenosis, lumbar region without neurogenic claudication: Secondary | ICD-10-CM | POA: Diagnosis not present

## 2020-04-27 ENCOUNTER — Other Ambulatory Visit: Payer: Self-pay

## 2020-04-27 ENCOUNTER — Emergency Department: Payer: Medicare HMO

## 2020-04-27 ENCOUNTER — Emergency Department
Admission: EM | Admit: 2020-04-27 | Discharge: 2020-04-27 | Disposition: A | Discharge status: Home / Self Care | Payer: Medicare HMO | Attending: Emergency Medicine | Admitting: Emergency Medicine

## 2020-04-27 DIAGNOSIS — R079 Chest pain, unspecified: Secondary | ICD-10-CM

## 2020-04-27 DIAGNOSIS — I129 Hypertensive chronic kidney disease with stage 1 through stage 4 chronic kidney disease, or unspecified chronic kidney disease: Secondary | ICD-10-CM | POA: Insufficient documentation

## 2020-04-27 DIAGNOSIS — J441 Chronic obstructive pulmonary disease with (acute) exacerbation: Secondary | ICD-10-CM | POA: Insufficient documentation

## 2020-04-27 DIAGNOSIS — R0789 Other chest pain: Secondary | ICD-10-CM | POA: Diagnosis not present

## 2020-04-27 DIAGNOSIS — Z7982 Long term (current) use of aspirin: Secondary | ICD-10-CM | POA: Insufficient documentation

## 2020-04-27 DIAGNOSIS — Z85528 Personal history of other malignant neoplasm of kidney: Secondary | ICD-10-CM | POA: Diagnosis not present

## 2020-04-27 DIAGNOSIS — J90 Pleural effusion, not elsewhere classified: Secondary | ICD-10-CM | POA: Diagnosis not present

## 2020-04-27 DIAGNOSIS — I4510 Unspecified right bundle-branch block: Secondary | ICD-10-CM | POA: Diagnosis not present

## 2020-04-27 DIAGNOSIS — N183 Chronic kidney disease, stage 3 unspecified: Secondary | ICD-10-CM | POA: Insufficient documentation

## 2020-04-27 DIAGNOSIS — Z79899 Other long term (current) drug therapy: Secondary | ICD-10-CM | POA: Insufficient documentation

## 2020-04-27 DIAGNOSIS — Z7902 Long term (current) use of antithrombotics/antiplatelets: Secondary | ICD-10-CM | POA: Diagnosis not present

## 2020-04-27 LAB — BASIC METABOLIC PANEL
Anion gap: 9 (ref 5–15)
BUN: 23 mg/dL (ref 8–23)
CO2: 27 mmol/L (ref 22–32)
Calcium: 8.6 mg/dL — ABNORMAL LOW (ref 8.9–10.3)
Chloride: 102 mmol/L (ref 98–111)
Creatinine, Ser: 1.38 mg/dL — ABNORMAL HIGH (ref 0.61–1.24)
GFR, Estimated: 51 mL/min — ABNORMAL LOW (ref >60)
Glucose, Bld: 149 mg/dL — ABNORMAL HIGH (ref 70–99)
Potassium: 3.1 mmol/L — ABNORMAL LOW (ref 3.5–5.1)
Sodium: 138 mmol/L (ref 135–145)

## 2020-04-27 LAB — CBC
HCT: 42.1 % (ref 39.0–52.0)
Hemoglobin: 14 g/dL (ref 13.0–17.0)
MCH: 30.3 pg (ref 26.0–34.0)
MCHC: 33.3 g/dL (ref 30.0–36.0)
MCV: 91.1 fL (ref 80.0–100.0)
Platelets: 217 10*3/uL (ref 150–400)
RBC: 4.62 MIL/uL (ref 4.22–5.81)
RDW: 13.9 % (ref 11.5–15.5)
WBC: 7.4 10*3/uL (ref 4.0–10.5)
nRBC: 0 % (ref 0.0–0.2)

## 2020-04-27 LAB — TROPONIN I (HIGH SENSITIVITY)
Troponin I (High Sensitivity): 6 ng/L (ref <18)
Troponin I (High Sensitivity): 6 ng/L (ref <18)
Troponin I (High Sensitivity): 5 ng/L (ref <18)

## 2020-04-27 NOTE — ED Provider Notes (Signed)
[facility] Emergency Department Provider Note   ____________________________________________   Event Date/Time   First MD Initiated Contact with Patient 04/27/20 1641     (approximate)  I have reviewed the triage vital signs and the nursing notes.   HISTORY  Chief Complaint Chest Pain    HPI Bradley Ellis is a 79 y.o. male who complains of chest tightness.  He has had chest tightness constantly for several days.  He was at the eye doctor today with his significant other and the pain got worse.  He came into the hospital for evaluation the pain had gone back to its baseline by the time I had seen him.  Pain is in the mid chest with no radiation.  It is not worse with exertion.  There is no nausea.         Past Medical History:  Diagnosis Date  . Chronic bronchitis (HCC)    not from smoking  . 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 GERD (gastroesophageal reflux disease)   . Hyperlipidemia   . Hypertension   . Laryngeal nodule    benign  . Sleep disorder     Patient Active Problem List   Diagnosis Date Noted  . Shortness of breath   . Tinea cruris   . Chronic midline low back pain without sciatica   . COPD exacerbation ([place]) 07/24/2019  . 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 PUD (peptic ulcer disease) 06/20/2018  . 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 Primary insomnia 04/29/2018  . History of kidney cancer 04/29/2018  . 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 Chronic kidney disease, stage III (moderate) ([place]) 04/29/2018  . Diastolic dysfunction [id number]  . Mild mitral regurgitation 04/17/2018  . Tricuspid regurgitation 04/17/2018  . Duodenal ulcer without hemorrhage or perforation 04/09/2018  . Neuropathy 04/09/2018  . Paroxysmal A-fib ([place]) 04/03/2018  . Aneurysm of abdominal aorta (HCC) 04/03/2018  . Pain in both lower extremities 11/19/2014  . Sleep disorder   . Hypertension   . GERD (gastroesophageal reflux disease)   .  Chronic bronchitis ([place])   . Hyperlipidemia     Past Surgical History:  Procedure Laterality Date  . BREAST CYST EXCISION Bilateral    also in groin and by rectum at other times (7)  . CERVICAL DISCECTOMY  [id number]  . ELBOW ARTHROPLASTY Left 1962  . KNEE ARTHROSCOPY Left    2 arthroscopy. Another surgery due to infection (cactus needle)  . NEPHRECTOMY Right 1998   for cancer. Done at [facility]. No other Rx  . PROSTATE SURGERY     about 15 yrs ago  . SKIN CANCER EXCISION     several in various places  . TONSILLECTOMY AND ADENOIDECTOMY  1962    Prior to Admission medications   Medication Sig Start Date End Date Taking? Authorizing Provider  aspirin EC 81 MG tablet Take 81 mg by mouth daily.    [provider]  atorvastatin (LIPITOR) 10 MG tablet Take 1 tablet (10 mg total) by mouth daily. 08/15/19   Steele Sizer, MD  atorvastatin (LIPITOR) 10 MG tablet Take by mouth. 02/18/20   [provider]  carboxymethylcellulose (REFRESH PLUS) 0.5 % SOLN Place 1 drop into both eyes 3 (three) times daily as needed.    [provider]  clopidogrel (PLAVIX) 75 MG tablet  02/14/20   [provider]  furosemide (LASIX) 40 MG tablet Take 40 mg by mouth as needed for fluid.    [provider]  gabapentin (NEURONTIN) 300 MG capsule Take 1 capsule (300 mg total) by mouth at bedtime. 08/15/19   Steele Sizer, MD  irbesartan-hydrochlorothiazide (AVALIDE) 300-12.5 MG tablet Take 1 tablet by mouth once daily 02/06/20   Steele Sizer, MD  lidocaine (LIDODERM) 5 % 1 patch daily. 12/25/19   [provider]  loperamide (IMODIUM A-D) 2 MG tablet Take 2 mg by mouth 4 (four) times daily as needed for diarrhea or loose stools.    [provider]  loperamide (IMODIUM) 2 MG capsule  02/20/20   [provider]  loratadine (CLARITIN) 10 MG tablet Take 10 mg by mouth daily.    [provider]  methylPREDNISolone (MEDROL DOSEPAK) 4 MG TBPK  tablet See admin instructions. 01/31/20   [provider]  Niacin (VITAMIN B-3 PO) Take 1 tablet by mouth daily. VITAL FUERTE H3 100MG  CAPSULE    [provider]  pantoprazole (PROTONIX) 40 MG tablet Take 1 tablet by mouth once daily 02/17/20   Steele Sizer, MD  predniSONE (STERAPRED UNI-PAK 21 TAB) 10 MG (21) TBPK tablet Per packaging instructions 10/18/19   Nance Pear, MD  tiZANidine (ZANAFLEX) 4 MG tablet TAKE 1 TABLET BY MOUTH THREE TIMES DAILY AS NEEDED FOR MUSCLE PAIN 01/30/20   [provider]  traZODone (DESYREL) 50 MG tablet TAKE 1 TABLET BY MOUTH AT BEDTIME 11/05/19   Sowles, Drue Stager, MD  Yohimbe Bark 500 MG CAPS Take 1 capsule by mouth 2 (two) times daily.    [provider]    Allergies Sulfa antibiotics  Family History  Problem Relation Age of Onset  . Cancer Mother        breast cancer  . Heart disease Sister   . Stroke Sister   . Diabetes Sister   . Diabetes Maternal Grandmother     Social History Social History   Tobacco Use  . Smoking status: Never Smoker  . Smokeless tobacco: Never Used  Vaping Use  . Vaping Use: Never used  Substance Use Topics  . Alcohol use: No  . Drug use: No    Review of Systems  Constitutional: No fever/chills Eyes: No visual changes. ENT: No sore throat. Cardiovascular:  Respiratory: Denies shortness of breath. Gastrointestinal: No abdominal pain.  No nausea, no vomiting.  No diarrhea.  No constipation. Genitourinary: Negative for dysuria. Musculoskeletal: Negative for back pain. Skin: Negative for rash. Neurological: Negative for headaches, focal weakness   ____________________________________________   PHYSICAL EXAM:  VITAL SIGNS: ED Triage Vitals  Enc Vitals Group     BP 04/27/20 1259 108/77     Pulse Rate 04/27/20 1259 86     Resp 04/27/20 1259 17     Temp 04/27/20 1259 98.2 F (36.8 C)     Temp Source 04/27/20 1259 Oral     SpO2 04/27/20 1259 97 %     Weight --       Height --      Head Circumference --      Peak Flow --      Pain Score 04/27/20 1312 5     Pain Loc --      Pain Edu? --      Excl. in [place]? --     Constitutional: Alert and oriented. Well appearing and in no acute distress. Eyes: Conjunctivae are normal.  Head: Atraumatic. Nose: No congestion/rhinnorhea. Mouth/Throat: Mucous membranes are moist.  Oropharynx non-erythematous. Neck: No stridor.  Cardiovascular: Normal rate, regular rhythm. Grossly normal heart sounds.  Good peripheral  circulation. Respiratory: Normal respiratory effort.  No retractions. Lungs CTAB. Gastrointestinal: Soft and nontender. No distention. No abdominal bruits. No CVA tenderness. No lower extremity tenderness nor edema.   Neurologic:  Normal speech and language. No gross focal neurologic deficits are appreciated. . Skin:  Skin is warm, dry and intact. No rash noted.  ____________________________________________   LABS (all labs ordered are listed, but only abnormal results are displayed)  Labs Reviewed  BASIC METABOLIC PANEL - Abnormal; Notable for the following components:      Result Value   Potassium 3.1 (*)    Glucose, Bld 149 (*)    Creatinine, Ser 1.38 (*)    Calcium 8.6 (*)    GFR, Estimated 51 (*)    All other components within normal limits  CBC  TROPONIN I (HIGH SENSITIVITY)  TROPONIN I (HIGH SENSITIVITY)  TROPONIN I (HIGH SENSITIVITY)  TROPONIN I (HIGH SENSITIVITY)  TROPONIN I (HIGH SENSITIVITY)   ____________________________________________  EKG EKG read interpreted by me shows normal sinus rhythm rate of 84 normal axis right bundle branch block looks similar to EKG from 18 March of this year.  ____________________________________________  RADIOLOGY Gertha Calkin, personally viewed and evaluated these images (plain radiographs) as part of my medical decision making, as well as reviewing the written report by the radiologist.  ED MD interpretation: X-ray read by radiology  reviewed by me shows no acute pathology  Official radiology report(s): DG Chest 2 View  Result Date: 04/27/2020 CLINICAL DATA:  Increasing chest tightness EXAM: CHEST - 2 VIEW COMPARISON:  07/24/2019, 10/18/2019 FINDINGS: Frontal and lateral views of the chest demonstrate stable right pericardial cyst. Cardiac silhouette is unremarkable. No acute airspace disease, effusion, or pneumothorax. No acute bony abnormalities. IMPRESSION: 1. No acute intrathoracic process. 2. Stable pericardial cyst. Electronically Signed   By: Randa Ngo M.D.   On: 04/27/2020 17:19    ____________________________________________   PROCEDURES  Procedure(s) performed (including Critical Care):  Procedures   ____________________________________________   INITIAL IMPRESSION / ASSESSMENT AND PLAN / ED COURSE  Patient walks about 100 feet to the bathroom without any change in his baseline chest pain.  EKG looks stable if his third troponin comes back stable I will send him home.  I called and discussed his case with the cardiology fellow on-call at [facility] today and he agrees.  Troponins are negative and 1 several hours after the first 2 is slightly lower.  Patient will follow up with cardiology return if he is any worse.  He has a cath scheduled for 8-1/2 days from now.              ____________________________________________   FINAL CLINICAL IMPRESSION(S) / ED DIAGNOSES  Final diagnoses:  Nonspecific chest pain     ED Discharge Orders    None      *Please note:  Bradley Ellis was evaluated in Emergency Department on 04/27/2020 for the symptoms described in the history of present illness. He was evaluated in the context of the global COVID-19 pandemic, which necessitated consideration that the patient might be at risk for infection with the SARS-CoV-2 virus that causes COVID-19. Institutional protocols and algorithms that pertain to the evaluation of patients at risk for COVID-19 are in a state  of rapid change based on information released by regulatory bodies including the CDC and federal and state organizations. These policies and algorithms were followed during the patient's care in the ED.  Some ED evaluations and interventions may be delayed as a  result of limited staffing during and the pandemic.*   Note:  This document was prepared using Dragon voice recognition software and may include unintentional dictation errors.    Nena Polio, MD 04/27/20 2325

## 2020-04-27 NOTE — Discharge Instructions (Signed)
Please return for any worsening pain or shortness of breath or other problems even if it is in the middle of the night.  Remember to call 911.  Otherwise please be sure to follow-up with your cardiologist for the catheterization you have scheduled on the 30th.

## 2020-04-27 NOTE — ED Triage Notes (Signed)
Pt comes via EMS from home with c/o CP. Pt states chest tightness. Pt was at eye doctor and started to have CP. Pt states it got worse and so they called EMS.  Pt states central CP with no radiation. Pt states may need some stents. Pt took 2 nitros with no relief and EMS gave pt 3 aspirin.  Pt states 5/10 pain. Pt has 18g in right arm

## 2020-04-28 ENCOUNTER — Other Ambulatory Visit: Payer: Self-pay | Admitting: Family Medicine

## 2020-04-28 DIAGNOSIS — I10 Essential (primary) hypertension: Secondary | ICD-10-CM

## 2020-04-29 DIAGNOSIS — E785 Hyperlipidemia, unspecified: Secondary | ICD-10-CM | POA: Diagnosis not present

## 2020-04-29 DIAGNOSIS — H9190 Unspecified hearing loss, unspecified ear: Secondary | ICD-10-CM | POA: Diagnosis not present

## 2020-04-29 DIAGNOSIS — I714 Abdominal aortic aneurysm, without rupture: Secondary | ICD-10-CM | POA: Diagnosis not present

## 2020-04-29 DIAGNOSIS — R1013 Epigastric pain: Secondary | ICD-10-CM | POA: Diagnosis not present

## 2020-04-29 DIAGNOSIS — Z882 Allergy status to sulfonamides status: Secondary | ICD-10-CM | POA: Diagnosis not present

## 2020-04-29 DIAGNOSIS — I700 Atherosclerosis of aorta: Secondary | ICD-10-CM | POA: Diagnosis not present

## 2020-04-29 DIAGNOSIS — I129 Hypertensive chronic kidney disease with stage 1 through stage 4 chronic kidney disease, or unspecified chronic kidney disease: Secondary | ICD-10-CM | POA: Diagnosis not present

## 2020-04-29 DIAGNOSIS — Z7902 Long term (current) use of antithrombotics/antiplatelets: Secondary | ICD-10-CM | POA: Diagnosis not present

## 2020-04-29 DIAGNOSIS — M1990 Unspecified osteoarthritis, unspecified site: Secondary | ICD-10-CM | POA: Diagnosis not present

## 2020-05-06 DIAGNOSIS — I25118 Atherosclerotic heart disease of native coronary artery with other forms of angina pectoris: Secondary | ICD-10-CM | POA: Diagnosis not present

## 2020-05-06 DIAGNOSIS — Z8673 Personal history of transient ischemic attack (TIA), and cerebral infarction without residual deficits: Secondary | ICD-10-CM | POA: Diagnosis not present

## 2020-05-06 DIAGNOSIS — Z955 Presence of coronary angioplasty implant and graft: Secondary | ICD-10-CM | POA: Diagnosis not present

## 2020-05-06 DIAGNOSIS — Z79899 Other long term (current) drug therapy: Secondary | ICD-10-CM | POA: Diagnosis not present

## 2020-05-06 DIAGNOSIS — Z791 Long term (current) use of non-steroidal anti-inflammatories (NSAID): Secondary | ICD-10-CM | POA: Diagnosis not present

## 2020-05-06 DIAGNOSIS — E785 Hyperlipidemia, unspecified: Secondary | ICD-10-CM | POA: Diagnosis not present

## 2020-05-06 DIAGNOSIS — I25110 Atherosclerotic heart disease of native coronary artery with unstable angina pectoris: Secondary | ICD-10-CM | POA: Diagnosis not present

## 2020-05-06 DIAGNOSIS — Z7902 Long term (current) use of antithrombotics/antiplatelets: Secondary | ICD-10-CM | POA: Diagnosis not present

## 2020-05-06 DIAGNOSIS — Z7901 Long term (current) use of anticoagulants: Secondary | ICD-10-CM | POA: Diagnosis not present

## 2020-05-06 DIAGNOSIS — Z882 Allergy status to sulfonamides status: Secondary | ICD-10-CM | POA: Diagnosis not present

## 2020-05-12 ENCOUNTER — Other Ambulatory Visit: Payer: Self-pay | Admitting: Family Medicine

## 2020-05-12 DIAGNOSIS — I10 Essential (primary) hypertension: Secondary | ICD-10-CM

## 2020-05-17 DIAGNOSIS — R0600 Dyspnea, unspecified: Secondary | ICD-10-CM | POA: Diagnosis not present

## 2020-05-18 ENCOUNTER — Emergency Department: Payer: Medicare HMO

## 2020-05-18 ENCOUNTER — Emergency Department
Admission: EM | Admit: 2020-05-18 | Discharge: 2020-05-18 | Disposition: A | Discharge status: Home / Self Care | Payer: Medicare HMO | Attending: Emergency Medicine | Admitting: Emergency Medicine

## 2020-05-18 ENCOUNTER — Other Ambulatory Visit: Payer: Self-pay

## 2020-05-18 DIAGNOSIS — Z85528 Personal history of other malignant neoplasm of kidney: Secondary | ICD-10-CM | POA: Diagnosis not present

## 2020-05-18 DIAGNOSIS — Z7982 Long term (current) use of aspirin: Secondary | ICD-10-CM | POA: Diagnosis not present

## 2020-05-18 DIAGNOSIS — Z79899 Other long term (current) drug therapy: Secondary | ICD-10-CM | POA: Insufficient documentation

## 2020-05-18 DIAGNOSIS — Z7902 Long term (current) use of antithrombotics/antiplatelets: Secondary | ICD-10-CM | POA: Diagnosis not present

## 2020-05-18 DIAGNOSIS — Z03818 Encounter for observation for suspected exposure to other biological agents ruled out: Secondary | ICD-10-CM | POA: Diagnosis not present

## 2020-05-18 DIAGNOSIS — R079 Chest pain, unspecified: Secondary | ICD-10-CM

## 2020-05-18 DIAGNOSIS — R42 Dizziness and giddiness: Secondary | ICD-10-CM | POA: Diagnosis not present

## 2020-05-18 DIAGNOSIS — J441 Chronic obstructive pulmonary disease with (acute) exacerbation: Secondary | ICD-10-CM | POA: Insufficient documentation

## 2020-05-18 DIAGNOSIS — N183 Chronic kidney disease, stage 3 unspecified: Secondary | ICD-10-CM | POA: Insufficient documentation

## 2020-05-18 DIAGNOSIS — R0602 Shortness of breath: Secondary | ICD-10-CM | POA: Diagnosis not present

## 2020-05-18 DIAGNOSIS — I517 Cardiomegaly: Secondary | ICD-10-CM | POA: Diagnosis not present

## 2020-05-18 DIAGNOSIS — I129 Hypertensive chronic kidney disease with stage 1 through stage 4 chronic kidney disease, or unspecified chronic kidney disease: Secondary | ICD-10-CM | POA: Insufficient documentation

## 2020-05-18 LAB — CBC
HCT: 41.3 % (ref 39.0–52.0)
Hemoglobin: 13.2 g/dL (ref 13.0–17.0)
MCH: 29.8 pg (ref 26.0–34.0)
MCHC: 32 g/dL (ref 30.0–36.0)
MCV: 93.2 fL (ref 80.0–100.0)
Platelets: 235 10*3/uL (ref 150–400)
RBC: 4.43 MIL/uL (ref 4.22–5.81)
RDW: 13.7 % (ref 11.5–15.5)
WBC: 6.8 10*3/uL (ref 4.0–10.5)
nRBC: 0 % (ref 0.0–0.2)

## 2020-05-18 LAB — BASIC METABOLIC PANEL
Anion gap: 12 (ref 5–15)
BUN: 29 mg/dL — ABNORMAL HIGH (ref 8–23)
CO2: 27 mmol/L (ref 22–32)
Calcium: 9 mg/dL (ref 8.9–10.3)
Chloride: 102 mmol/L (ref 98–111)
Creatinine, Ser: 1.42 mg/dL — ABNORMAL HIGH (ref 0.61–1.24)
GFR, Estimated: 50 mL/min — ABNORMAL LOW (ref >60)
Glucose, Bld: 147 mg/dL — ABNORMAL HIGH (ref 70–99)
Potassium: 3.4 mmol/L — ABNORMAL LOW (ref 3.5–5.1)
Sodium: 141 mmol/L (ref 135–145)

## 2020-05-18 LAB — TROPONIN I (HIGH SENSITIVITY)
Troponin I (High Sensitivity): 4 ng/L (ref <18)
Troponin I (High Sensitivity): 4 ng/L (ref <18)

## 2020-05-18 NOTE — ED Provider Notes (Signed)
[facility] Emergency Department Provider Note ____________________________________________   Event Date/Time   First MD Initiated Contact with Patient 05/18/20 1017     (approximate)  I have reviewed the triage vital signs and the nursing notes.   HISTORY  Chief Complaint Chest Pain    HPI Bradley Ellis is a 80 y.o. male with PMH as noted below as well as CAD with chronic unstable angina who presents with chest pain, acute onset this morning when he walked from his car to the outpatient office.  He was here for a COVID test prior to travel.  The patient describes the pain as substernal and pressure-like.  He has had chronic chest pain like this for many months and states that he has similar exacerbations frequently.  He states that the pain feels identical to previous exacerbations.  He has taken 6 nitro in the last 2 weeks.  He denies associated shortness of breath, lightheadedness, fever, cough, nausea or vomiting.  He states that the pain is now back to his baseline level.  Past Medical History:  Diagnosis Date  . Chronic bronchitis (HCC)    not from smoking  . 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 GERD (gastroesophageal reflux disease)   . Hyperlipidemia   . Hypertension   . Laryngeal nodule    benign  . Sleep disorder     Patient Active Problem List   Diagnosis Date Noted  . Shortness of breath   . Tinea cruris   . Chronic midline low back pain without sciatica   . COPD exacerbation ([place]) 07/24/2019  . 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 PUD (peptic ulcer disease) 06/20/2018  . 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 Primary insomnia 04/29/2018  . History of kidney cancer 04/29/2018  . 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 Chronic kidney disease, stage III (moderate) ([place]) 04/29/2018  . Diastolic dysfunction 40/98/1191  . Mild mitral regurgitation 04/17/2018  . Tricuspid regurgitation 04/17/2018  . Duodenal ulcer without hemorrhage or  perforation 04/09/2018  . Neuropathy 04/09/2018  . Paroxysmal A-fib ([place]) 04/03/2018  . Aneurysm of abdominal aorta (HCC) 04/03/2018  . Pain in both lower extremities 11/19/2014  . Sleep disorder   . Hypertension   . GERD (gastroesophageal reflux disease)   . Chronic bronchitis ([place])   . Hyperlipidemia     Past Surgical History:  Procedure Laterality Date  . BREAST CYST EXCISION Bilateral    also in groin and by rectum at other times (7)  . CERVICAL DISCECTOMY  [id number]  . ELBOW ARTHROPLASTY Left 1962  . KNEE ARTHROSCOPY Left    2 arthroscopy. Another surgery due to infection (cactus needle)  . NEPHRECTOMY Right 1998   for cancer. Done at [facility]. No other Rx  . PROSTATE SURGERY     about 15 yrs ago  . SKIN CANCER EXCISION     several in various places  . TONSILLECTOMY AND ADENOIDECTOMY  1962    Prior to Admission medications   Medication Sig Start Date End Date Taking? Authorizing Provider  aspirin EC 81 MG tablet Take 81 mg by mouth daily.    [provider]  atorvastatin (LIPITOR) 10 MG tablet Take 1 tablet (10 mg total) by mouth daily. 08/15/19   Steele Sizer, MD  atorvastatin (LIPITOR) 10 MG tablet Take by mouth. 02/18/20   [provider]  carboxymethylcellulose (REFRESH PLUS) 0.5 % SOLN Place 1 drop into both eyes 3 (three) times daily as needed.  [provider]  clopidogrel (PLAVIX) 75 MG tablet  02/14/20   [provider]  furosemide (LASIX) 40 MG tablet Take 40 mg by mouth as needed for fluid.    [provider]  gabapentin (NEURONTIN) 300 MG capsule Take 1 capsule (300 mg total) by mouth at bedtime. 08/15/19   Steele Sizer, MD  irbesartan-hydrochlorothiazide (AVALIDE) 300-12.5 MG tablet Take 1 tablet by mouth once daily 02/06/20   Steele Sizer, MD  lidocaine (LIDODERM) 5 % 1 patch daily. 12/25/19   [provider]  loperamide (IMODIUM A-D) 2 MG tablet Take 2 mg by mouth 4 (four) times daily as needed  for diarrhea or loose stools.    [provider]  loperamide (IMODIUM) 2 MG capsule  02/20/20   [provider]  loratadine (CLARITIN) 10 MG tablet Take 10 mg by mouth daily.    [provider]  methylPREDNISolone (MEDROL DOSEPAK) 4 MG TBPK tablet See admin instructions. 01/31/20   [provider]  Niacin (VITAMIN B-3 PO) Take 1 tablet by mouth daily. VITAL FUERTE H3 100MG  CAPSULE    [provider]  pantoprazole (PROTONIX) 40 MG tablet Take 1 tablet by mouth once daily 02/17/20   Steele Sizer, MD  predniSONE (STERAPRED UNI-PAK 21 TAB) 10 MG (21) TBPK tablet Per packaging instructions 10/18/19   Nance Pear, MD  tiZANidine (ZANAFLEX) 4 MG tablet TAKE 1 TABLET BY MOUTH THREE TIMES DAILY AS NEEDED FOR MUSCLE PAIN 01/30/20   [provider]  traZODone (DESYREL) 50 MG tablet TAKE 1 TABLET BY MOUTH AT BEDTIME 11/05/19   Sowles, Drue Stager, MD  Yohimbe Bark 500 MG CAPS Take 1 capsule by mouth 2 (two) times daily.    [provider]    Allergies Sulfa antibiotics  Family History  Problem Relation Age of Onset  . Cancer Mother        breast cancer  . Heart disease Sister   . Stroke Sister   . Diabetes Sister   . Diabetes Maternal Grandmother     Social History Social History   Tobacco Use  . Smoking status: Never Smoker  . Smokeless tobacco: Never Used  Vaping Use  . Vaping Use: Never used  Substance Use Topics  . Alcohol use: No  . Drug use: No    Review of Systems  Constitutional: No fever/chills Eyes: No visual changes. ENT: No sore throat. Cardiovascular: Positive for chest pain. Respiratory: Denies shortness of breath. Gastrointestinal: No nausea, no vomiting.  No diarrhea.  Genitourinary: Negative for dysuria.  Musculoskeletal: Negative for back pain. Skin: Negative for rash. Neurological: Negative for headaches, focal weakness or numbness.   ____________________________________________   PHYSICAL  EXAM:  VITAL SIGNS: ED Triage Vitals  Enc Vitals Group     BP 05/18/20 0949 132/87     Pulse Rate 05/18/20 0949 73     Resp 05/18/20 0949 18     Temp 05/18/20 0949 97.8 F (36.6 C)     Temp src --      SpO2 05/18/20 0949 98 %     Weight 05/18/20 0950 164 lb (74.4 kg)     Height 05/18/20 0950 5\' 8"  (1.727 m)     Head Circumference --      Peak Flow --      Pain Score 05/18/20 0949 6     Pain Loc --      Pain Edu? --      Excl. in [place]? --     Constitutional: Alert and  oriented.  Relatively well appearing and in no acute distress. Eyes: Conjunctivae are normal.  Head: Atraumatic. Nose: No congestion/rhinnorhea. Mouth/Throat: Mucous membranes are moist.   Neck: Normal range of motion.  Cardiovascular: Normal rate, regular rhythm. Grossly normal heart sounds.  Good peripheral circulation. Respiratory: Normal respiratory effort.  No retractions. Lungs CTAB. Gastrointestinal: No distention.  Musculoskeletal: No lower extremity edema.  Extremities warm and well perfused.  Neurologic:  Normal speech and language. No gross focal neurologic deficits are appreciated.  Skin:  Skin is warm and dry. No rash noted. Psychiatric: Mood and affect are normal. Speech and behavior are normal.  ____________________________________________   LABS (all labs ordered are listed, but only abnormal results are displayed)  Labs Reviewed  BASIC METABOLIC PANEL - Abnormal; Notable for the following components:      Result Value   Potassium 3.4 (*)    Glucose, Bld 147 (*)    BUN 29 (*)    Creatinine, Ser 1.42 (*)    GFR, Estimated 50 (*)    All other components within normal limits  CBC  TROPONIN I (HIGH SENSITIVITY)  TROPONIN I (HIGH SENSITIVITY)   ____________________________________________  EKG  ED ECG REPORT I, Arta Silence, the attending physician, personally viewed and interpreted this ECG.  Date: 05/18/2020 EKG Time: 0945 Rate: 72 Rhythm: normal sinus rhythm QRS Axis:  normal Intervals: RBBB ST/T Wave abnormalities: normal Narrative Interpretation: no evidence of acute ischemia  ____________________________________________  RADIOLOGY  Chest x-ray interpreted by me shows unchanged mediastinal mass and no focal infiltrate or edema  ____________________________________________   PROCEDURES  Procedure(s) performed: No  Procedures  Critical Care performed: No ____________________________________________   INITIAL IMPRESSION / ASSESSMENT AND PLAN / ED COURSE  Pertinent labs & imaging results that were available during my care of the patient were reviewed by me and considered in my medical decision making (see chart for details).  80 year old male with PMH as noted above including CAD and chronic unstable angina presents with an exacerbation of his chronic chest pain acute onset today when he walks to a clinic appointment for COVID test.  He denies any other acute symptoms.  He states that this is very similar to prior exacerbations of his pain.  I reviewed the past medical records in [place].  The patient follows with cardiology at [facility].  He had cardiac catheterization on 12/30 showing chronic total occlusion of the LAD with collaterals.  He is recommended for aggressive secondary prevention.  He is scheduled for a visit later this month.  He has a known pericardial cyst but this is not thought to be the primary cause of his angina.  The patient had a pulmonology visit at [facility] yesterday due to increased dyspnea on exertion.  He also had an ED visit here last month with a similar presentation and negative cardiac enzymes.  On exam currently, the patient is well-appearing.  His vital signs are normal.  Physical exam is unremarkable.  He states that the pain has returned to its baseline level.  EKG shows a right bundle with no ischemic findings, no change when compared to EKG from 12/21.  Basic labs obtained from triage are within normal limits  for the patient.  Chest x-ray shows no acute findings, and his initial troponin is negative.  Overall presentation is consistent with chronic unstable angina.  Given the chronic nature of the pain, there is no evidence of acute MI.  The chest pain has returned to its baseline level.  Given the  negative work-up so far and normal vital signs, there is no clinical evidence for PE, aortic dissection, or other vascular etiology.  We will plan for repeat troponin.  If there is no increase in the patient continues to not have any new pain, anticipate discharge with cardiology follow-up.  ----------------------------------------- 1:38 PM on 05/18/2020 -----------------------------------------  The patient has remained with no new pain in the ED. His repeat troponin is negative. He very much would like to go home. I think this is reasonable given that this episode appears to be an exacerbation of his chronic angina rather than any acute cardiac event. He agrees to follow-up with his cardiologist at [facility]. Return precautions given, he expresses understanding. ____________________________________________   FINAL CLINICAL IMPRESSION(S) / ED DIAGNOSES  Final diagnoses:  Chest pain, unspecified type      NEW MEDICATIONS STARTED DURING THIS VISIT:  New Prescriptions   No medications on file     Note:  This document was prepared using Dragon voice recognition software and may include unintentional dictation errors.   Arta Silence, MD 05/18/20 1339

## 2020-05-18 NOTE — Discharge Instructions (Addendum)
Follow-up with your cardiologist at [facility] as discussed.  Return to the the ER for new, worsening, or persistent chest pain, difficulty breathing, weakness or lightheadedness, or any other new or worsening symptoms that concern you.

## 2020-05-18 NOTE — ED Triage Notes (Signed)
Pt comes via POV from home with c/o CP and dizziness. Pt states this started this am. Pt states he went to [facility] and they brought him here.  Pt denies any radiation, N/V. Pt states SOB.

## 2020-05-25 DIAGNOSIS — R0789 Other chest pain: Secondary | ICD-10-CM | POA: Diagnosis not present

## 2020-05-25 DIAGNOSIS — I2510 Atherosclerotic heart disease of native coronary artery without angina pectoris: Secondary | ICD-10-CM | POA: Diagnosis not present

## 2020-05-25 DIAGNOSIS — Z20822 Contact with and (suspected) exposure to covid-19: Secondary | ICD-10-CM | POA: Diagnosis not present

## 2020-05-25 DIAGNOSIS — M545 Low back pain, unspecified: Secondary | ICD-10-CM | POA: Diagnosis not present

## 2020-05-25 DIAGNOSIS — R202 Paresthesia of skin: Secondary | ICD-10-CM | POA: Diagnosis not present

## 2020-05-25 DIAGNOSIS — R072 Precordial pain: Secondary | ICD-10-CM | POA: Diagnosis not present

## 2020-05-25 DIAGNOSIS — R059 Cough, unspecified: Secondary | ICD-10-CM | POA: Diagnosis not present

## 2020-05-25 DIAGNOSIS — G4700 Insomnia, unspecified: Secondary | ICD-10-CM | POA: Diagnosis not present

## 2020-05-25 DIAGNOSIS — R079 Chest pain, unspecified: Secondary | ICD-10-CM | POA: Diagnosis not present

## 2020-05-25 DIAGNOSIS — E785 Hyperlipidemia, unspecified: Secondary | ICD-10-CM | POA: Diagnosis not present

## 2020-05-25 DIAGNOSIS — K219 Gastro-esophageal reflux disease without esophagitis: Secondary | ICD-10-CM | POA: Diagnosis not present

## 2020-05-25 DIAGNOSIS — R0602 Shortness of breath: Secondary | ICD-10-CM | POA: Diagnosis not present

## 2020-05-25 DIAGNOSIS — N189 Chronic kidney disease, unspecified: Secondary | ICD-10-CM | POA: Diagnosis not present

## 2020-05-25 DIAGNOSIS — I129 Hypertensive chronic kidney disease with stage 1 through stage 4 chronic kidney disease, or unspecified chronic kidney disease: Secondary | ICD-10-CM | POA: Diagnosis not present

## 2020-05-25 DIAGNOSIS — I10 Essential (primary) hypertension: Secondary | ICD-10-CM | POA: Diagnosis not present

## 2020-05-25 DIAGNOSIS — M7989 Other specified soft tissue disorders: Secondary | ICD-10-CM | POA: Diagnosis not present

## 2020-05-25 DIAGNOSIS — I714 Abdominal aortic aneurysm, without rupture: Secondary | ICD-10-CM | POA: Diagnosis not present

## 2020-05-25 DIAGNOSIS — I200 Unstable angina: Secondary | ICD-10-CM | POA: Diagnosis not present

## 2020-05-25 DIAGNOSIS — I4891 Unspecified atrial fibrillation: Secondary | ICD-10-CM | POA: Diagnosis not present

## 2020-05-26 DIAGNOSIS — E785 Hyperlipidemia, unspecified: Secondary | ICD-10-CM | POA: Diagnosis not present

## 2020-05-26 DIAGNOSIS — I4891 Unspecified atrial fibrillation: Secondary | ICD-10-CM | POA: Diagnosis not present

## 2020-05-26 DIAGNOSIS — I318 Other specified diseases of pericardium: Secondary | ICD-10-CM | POA: Diagnosis not present

## 2020-05-26 DIAGNOSIS — I200 Unstable angina: Secondary | ICD-10-CM | POA: Diagnosis not present

## 2020-05-26 DIAGNOSIS — I2510 Atherosclerotic heart disease of native coronary artery without angina pectoris: Secondary | ICD-10-CM | POA: Diagnosis not present

## 2020-05-26 DIAGNOSIS — R079 Chest pain, unspecified: Secondary | ICD-10-CM | POA: Diagnosis not present

## 2020-05-26 DIAGNOSIS — I10 Essential (primary) hypertension: Secondary | ICD-10-CM | POA: Diagnosis not present

## 2020-05-26 DIAGNOSIS — R2232 Localized swelling, mass and lump, left upper limb: Secondary | ICD-10-CM | POA: Diagnosis not present

## 2020-06-03 ENCOUNTER — Other Ambulatory Visit: Payer: Self-pay | Admitting: Family Medicine

## 2020-06-03 DIAGNOSIS — I10 Essential (primary) hypertension: Secondary | ICD-10-CM

## 2020-06-07 DIAGNOSIS — K901 Tropical sprue: Secondary | ICD-10-CM | POA: Diagnosis not present

## 2020-06-07 DIAGNOSIS — I200 Unstable angina: Secondary | ICD-10-CM | POA: Diagnosis not present

## 2020-08-13 IMAGING — CR DG CHEST 2V
1 series · 2 of 2 positions shown · non-contrast
Comparison: 06/05/2018

CLINICAL DATA: Shortness of breath, chest pain

EXAM:
CHEST - 2 VIEW

[Series 1: dg chest 2 view · 0.14mm/px · 2 of 2 slices shown]
[im 1/2]
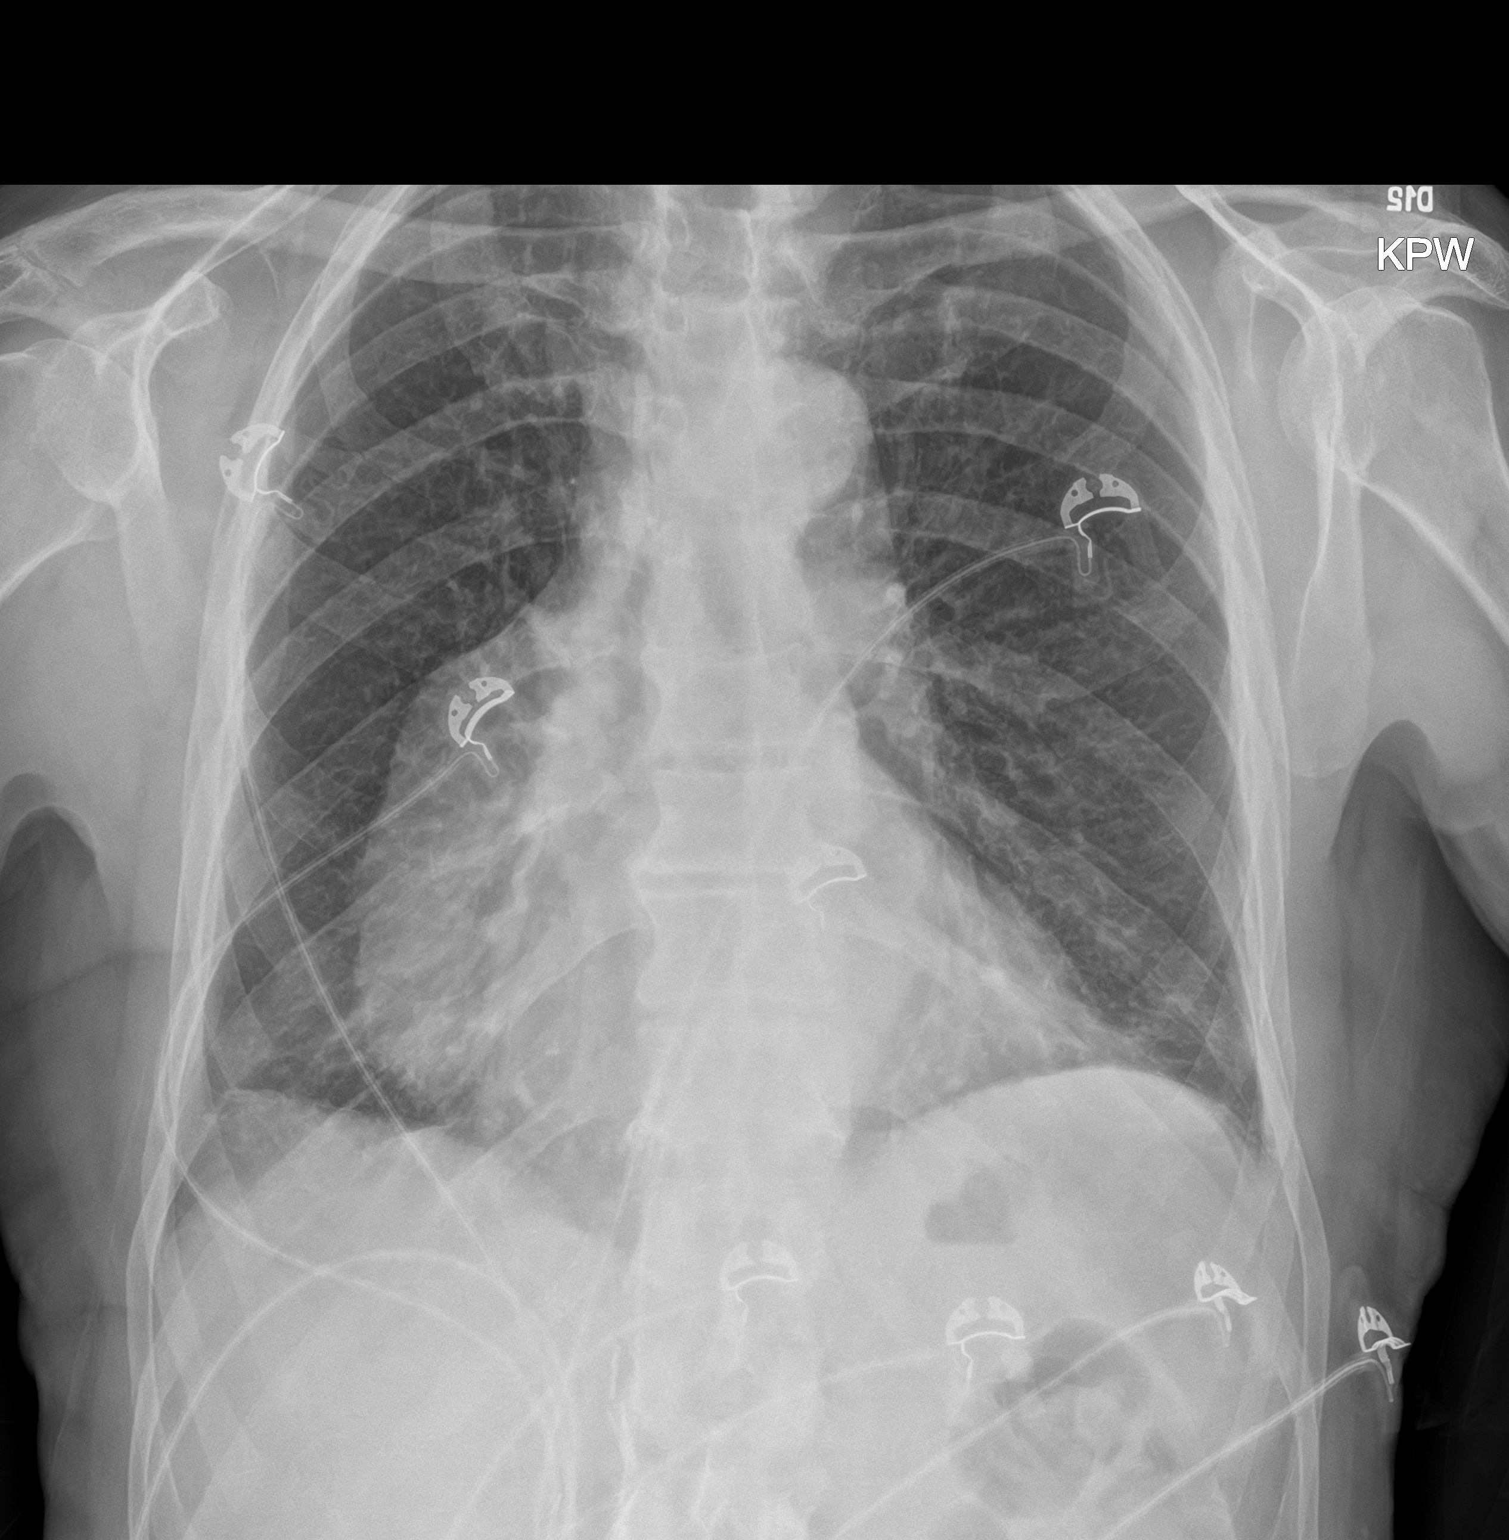
[im 2/2]
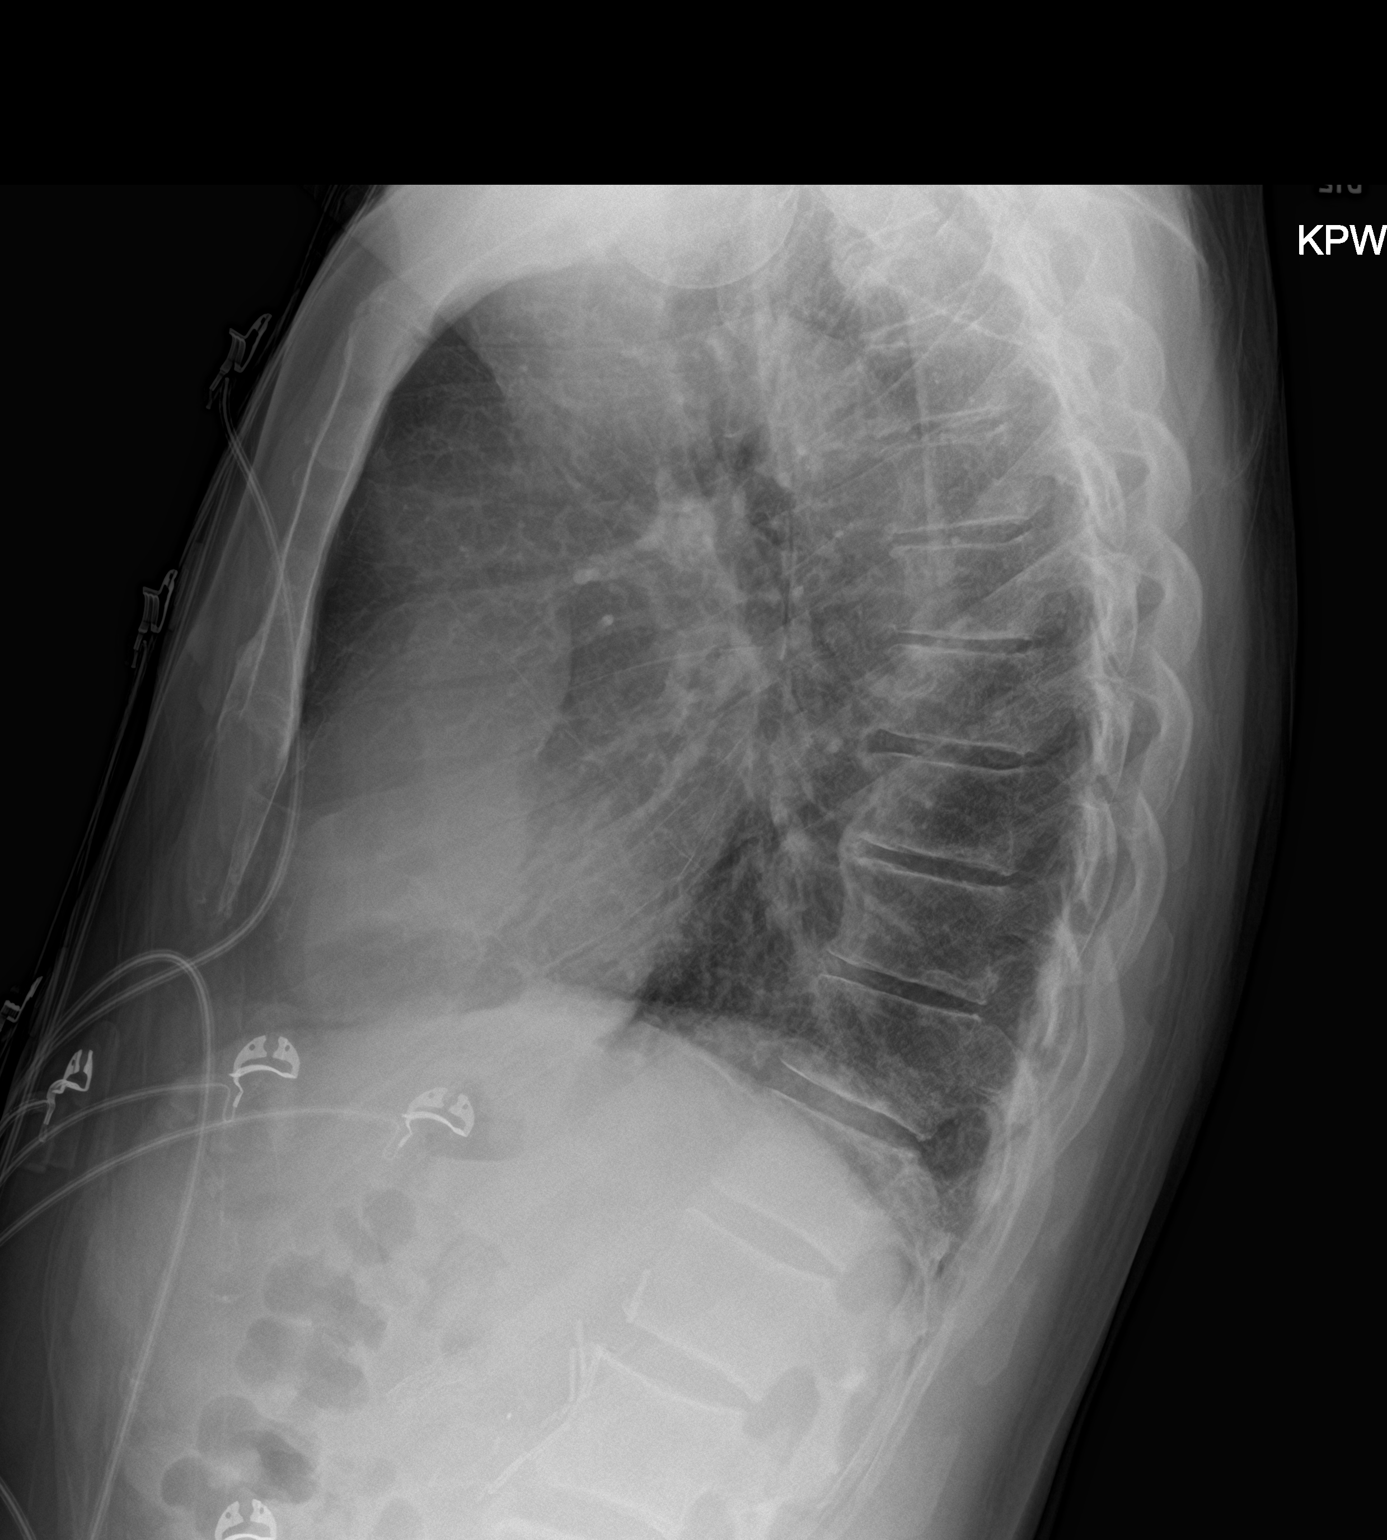

[2 of 2 positions shown; findings below may reference images not displayed]

FINDINGS: Again noted is the right mediastinal mass. Heart is borderline in
size. Hyperinflation of the lungs. Bibasilar scarring. No acute
confluent opacities or effusions. No acute bony abnormality.
IMPRESSION: Right anterior mediastinal mass again noted. Recommend further
evaluation with chest CT with IV contrast.

Hyperinflation, bibasilar scarring.

## 2020-12-22 ENCOUNTER — Encounter (INDEPENDENT_AMBULATORY_CARE_PROVIDER_SITE_OTHER): Payer: Self-pay | Admitting: Chiropractor

## 2020-12-22 ENCOUNTER — Other Ambulatory Visit: Payer: Self-pay

## 2020-12-22 ENCOUNTER — Ambulatory Visit: Payer: 59 | Attending: Chiropractor | Admitting: Chiropractor

## 2020-12-22 VITALS — BP 106/62 | HR 70 | Temp 97.6°F | Resp 16 | Ht 68.0 in | Wt 172.0 lb

## 2020-12-22 DIAGNOSIS — I10 Essential (primary) hypertension: Secondary | ICD-10-CM | POA: Insufficient documentation

## 2020-12-22 DIAGNOSIS — J40 Bronchitis, not specified as acute or chronic: Secondary | ICD-10-CM

## 2020-12-22 MED ORDER — METHYLPREDNISOLONE 4 MG TABLETS IN A DOSE PACK
ORAL_TABLET | ORAL | 0 refills | Status: AC
Start: 2020-12-22 — End: ?

## 2020-12-22 MED ORDER — DOXYCYCLINE HYCLATE 100 MG CAPSULE
100.0000 mg | ORAL_CAPSULE | Freq: Two times a day (BID) | ORAL | 0 refills | Status: AC
Start: 2020-12-22 — End: 2021-01-01

## 2020-12-22 NOTE — Nursing Note (Signed)
Travel Screening     Question   Response    In the last 10 days, have you been in contact with someone who was confirmed or suspected to have Coronavirus/COVID-19?  No / Unsure    Have you had a COVID-19 viral test in the last 10 days?  No    Do you have any of the following new or worsening symptoms?      Have you traveled internationally or domestically in the last month?  No      Travel History   Travel since 11/21/20    No documented travel since 11/21/20       Keniah Klemmer, LPN  07/25/377, 10:53

## 2020-12-22 NOTE — Patient Instructions (Signed)
Bronchitis, Antibiotic Treatment (Adult)    Bronchitis is an infection of the air passages (bronchial tubes) in your lungs. It often occurs when you have a cold. This illness is contagious during the first few days and is spread through the air by coughing and sneezing, or by direct contact (touching the sick person and then touching your own eyes, nose, or mouth).  Symptoms of bronchitis include cough with mucus (phlegm) and low-grade fever. Bronchitis usually lasts 7 to 14 days. Mild cases can be treated with simple home remedies. More severe infection is treated with an antibiotic.  Home care  Follow these guidelines when caring for yourself at home:   If your symptoms are severe, rest at home for the first 2 to 3 days. When you go back to your usual activities, don't let yourself get too tired.   Don't smoke. Also stay away from secondhand smoke.   You may use over-the-counter medicines to control fever or pain, unless another medicine was prescribed. If you have chronic liver or kidney disease or have ever had a stomach ulcer or gastrointestinal bleeding, talk with your healthcare provider before using these medicines. Also talk to your provider if you are taking medicine to prevent blood clots. Aspirin should never be given to anyone younger than 18 who is ill with a viral infection or fever. It may cause severe liver or brain damage.   Your appetite may be low, so a light diet is fine. Stay well hydrated by drinking 6 to 8 glasses of fluids per day. This includes water, soft drinks, sports drinks, juices, tea, or soup. Extra fluids will help loosen mucus in your nose and lungs.   Over-the-counter cough, cold, and sore-throat medicines will not shorten the length of the illness, but they may be helpful to reduce your symptoms. Don't use decongestants if you have high blood pressure.   Finish all antibiotic medicine. Do this even if you are feeling better after only a few days.  Follow-up care  Follow  up with your healthcare provider, or as advised. If you had an X-ray or ECG (electrocardiogram), a specialist will review it. You will be told of any new test results that may affect your care.  If you are age 65 or older, if you smoke, or if you have a chronic lung disease or condition that affects your immune system, askyour healthcare provider about getting apneumococcal vaccine and a yearly flu shot (influenza vaccine).  When to seek medical advice  Call your healthcare provider right away if any of these occur:   Fever of 100.4F (38C) or higher, or as directed by your healthcare provider   Coughing up more sputum   Weakness, drowsiness, headache, facial pain, ear pain, or a stiff neck  Call 911  Call 911 if any of these occur.   Coughing up blood   Weakness, drowsiness, headache, or stiff neck that get worse   Trouble breathing, wheezing, or pain with breathing  [employer] last reviewed this educational content on 10/06/2016   2000-2021 [employer]. All rights reserved. This information is not intended as a substitute for professional medical care. Always follow your healthcare professional's instructions.

## 2020-12-22 NOTE — Progress Notes (Signed)
[facility]  URGENT CARE, [facility] CENTER  [address]  [place] [address]  [id number]     Name: Jeffrey Dyer MRN:  [id number]   Date: 12/22/2020 DOB: Jun 11, 1940      Chief Complaint:   Chief Complaint   Patient presents with   . Chest Congestion     Has been ongoing for about 2 1/2 weeks, recently started again about two days ago    . Cough        History of Present Illness: Jeffrey Dyer is a 80 y.o. male who presents to the clinic today with complaint of chest congetion that began December 04, 2000.   Associated signs/symptoms: nasal congestion, non productive cough, headaches  Patient notes the severity of the symptoms is moderate.  Patient reports the duration of symptoms has been constant past 2 days.  The location is described as chest and throat.  Modifying factors:  Has tried ATB and medrol dose with no resolution.  Reports aggravating factors as air conditioning.  The context of illness is described as being treated by ER in [place] on August 1 with medrol dose pack and an antibiotic he can't recall the name of now. Relates symptoms improved but returned two days ago. Relates having a negative Covid test in ER.    Allergies:   Allergies   Allergen Reactions   . Sulfa (Sulfonamides)      Medications:   Current Outpatient Medications   Medication Sig   . atorvastatin (LIPITOR) 80 mg Oral Tablet Take 80 mg by mouth Once a day   . cyclobenzaprine (FLEXERIL) 10 mg Oral Tablet Take 10 mg by mouth Three times a day as needed   . doxycycline hyclate (VIBRAMYCIN) 100 mg Oral Capsule Take 1 Capsule (100 mg total) by mouth Twice daily for 10 days   . ELIQUIS 5 mg Oral Tablet Take 5 mg by mouth Twice daily   . Irbesartan-Hydrochlorothiazide 300-12.5 mg Oral Tablet Take 1 Tablet by mouth Once a day   . isosorbide mononitrate (IMDUR) 30 mg Oral Tablet Sustained Release 24 hr Take 1 Tablet by mouth Once a day   . Methylprednisolone (MEDROL DOSEPACK) 4 mg Oral Tablets, Dose Pack Take as  instructed.   . metoprolol tartrate (LOPRESSOR) 50 mg Oral Tablet Take 1 Tablet by mouth Once a day   . tiZANidine (ZANAFLEX) 4 mg Oral Tablet Take 1 Tablet by mouth Three times a day   . traZODone (DESYREL) 50 mg Oral Tablet Take 1 Tablet by mouth Once a day      Review of Systems:  All other systems negative unless checked:  General:  []  fever []  chills []  body aches  Eyes: []  eye irritation  ENT: []  rhinorrhea  [x]  nasal congestion []  sore throat [x]  sinus tenderness []  ear pain  CV: [x]  chest tightness   Pulm: [x]  cough []  shortness of breath []  wheezing.  GI: []  nausea []  vomiting []  diarrhea  Skin:  []  rash  Allergy/Immune: []  seasonal allergies []  asthma    Pysical Exam:  Vital signs:   BP 106/62 (Site: Upper Extremity, Patient Position: Sitting, Cuff Size: Adult)   Pulse 70   Temp 36.4 C (97.6 F) (Thermal Scan)   Resp 16   Ht 1.727 m (5\' 8" )   Wt 78 kg (172 lb)   SpO2 96%   BMI 26.15 kg/m       Constitutional: Alert/O x 4. NAD.  Well groomed. Appropriate dress.  Head:  Normocephalic and atraumatic.  Ears: Bilateral TMs dull with fluid and left with mild erythema. No perforation.  Eyes: Sclera white. Conjunctiva pink, without drainage.  Neck: Supple. Left anterior cervical lymphadenopathy.  Sinuses: No sinus tenderness to palpation.  Nose: Bilateral nares congested. No nasal drainage.  Throat: Uvula midline. Posterior oropharynx erythremic with clear drainage.   CV: Normal S1, S2. No murmur, no rub, no gallop.  Resp: Respirations nonlabored. CTA in all fields and mild decreased air movement in LUL.  Neuro: Normal speech. Normal cognition.  Skin: Pink, warm, and dry. No rash or lesions noted.  Musculoskeletal: Normal gait and station.    Assessment and Plan:  Diagnosis:    ICD-10-CM    1. Bronchitis  J40      Encounter Orders and Medications:  Orders Placed This Encounter   . Methylprednisolone (MEDROL DOSEPACK) 4 mg Oral Tablets, Dose Pack   . doxycycline hyclate (VIBRAMYCIN) 100 mg Oral Capsule        Plan:  . Rest and increase fluid intake.  . OTC Tylenol (acetaminophen) or Motrin (ibuprofen) for fever or discomfort.  [place] hand washing and wear face mask in public.  . Medications as prescribed.  All of the risks, benefits, side-effects, and how to take the medication was reviewed.  . Patient verbalizes understanding and agrees with treatment plan. No further questions or concerns at this time.  . Return to clinic if symptoms worsen or no improvement. ER for emergent symptoms.      Edwena Bunde, FNP

## 2021-05-18 IMAGING — CR DG CHEST 2V
1 series · 2 of 2 positions shown · non-contrast
Comparison: 07/24/2019, 10/18/2019

CLINICAL DATA: Increasing chest tightness

EXAM:
CHEST - 2 VIEW

[Series 1: dg chest 2 view · 0.14mm/px · 2 of 2 slices shown]
[im 1/2]
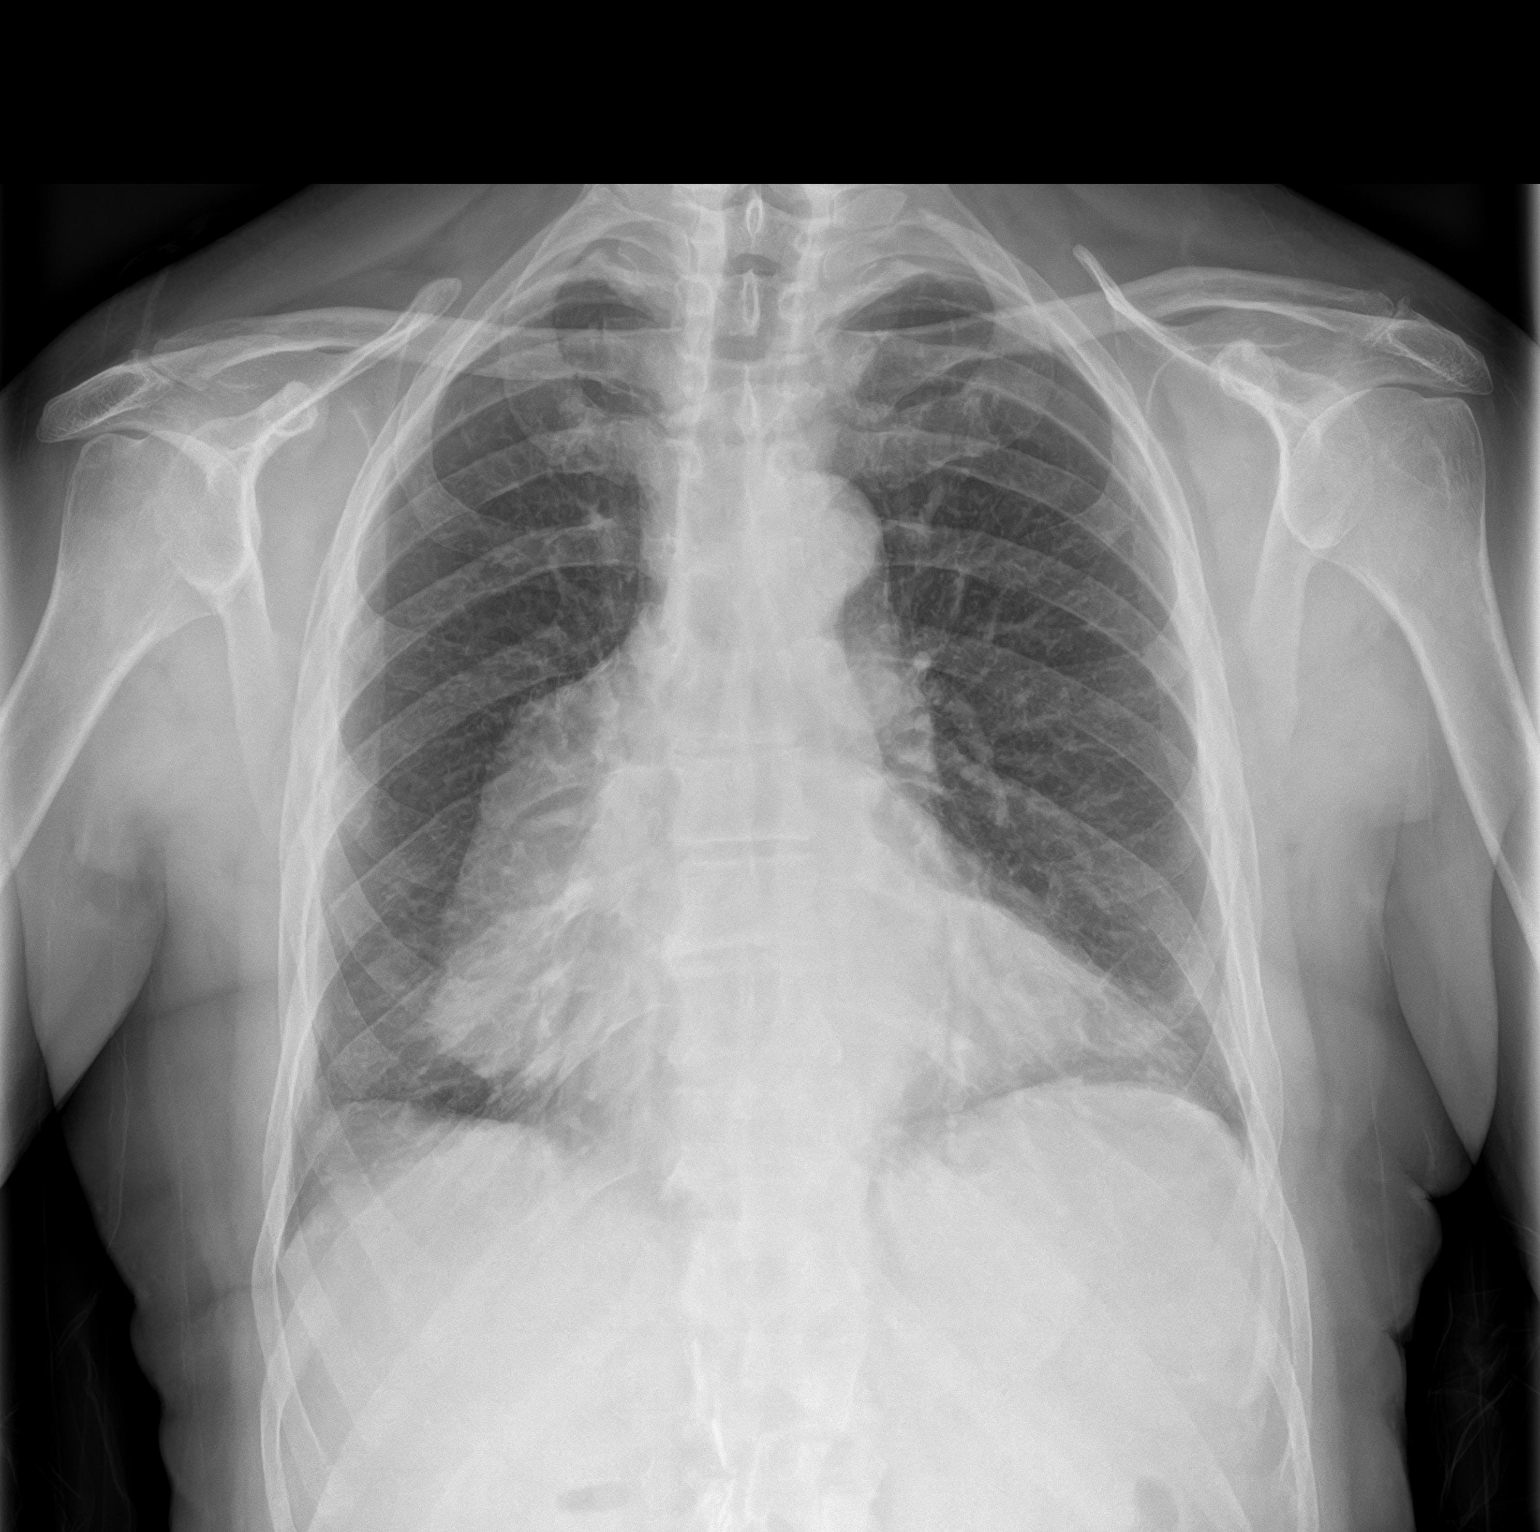
[im 2/2]
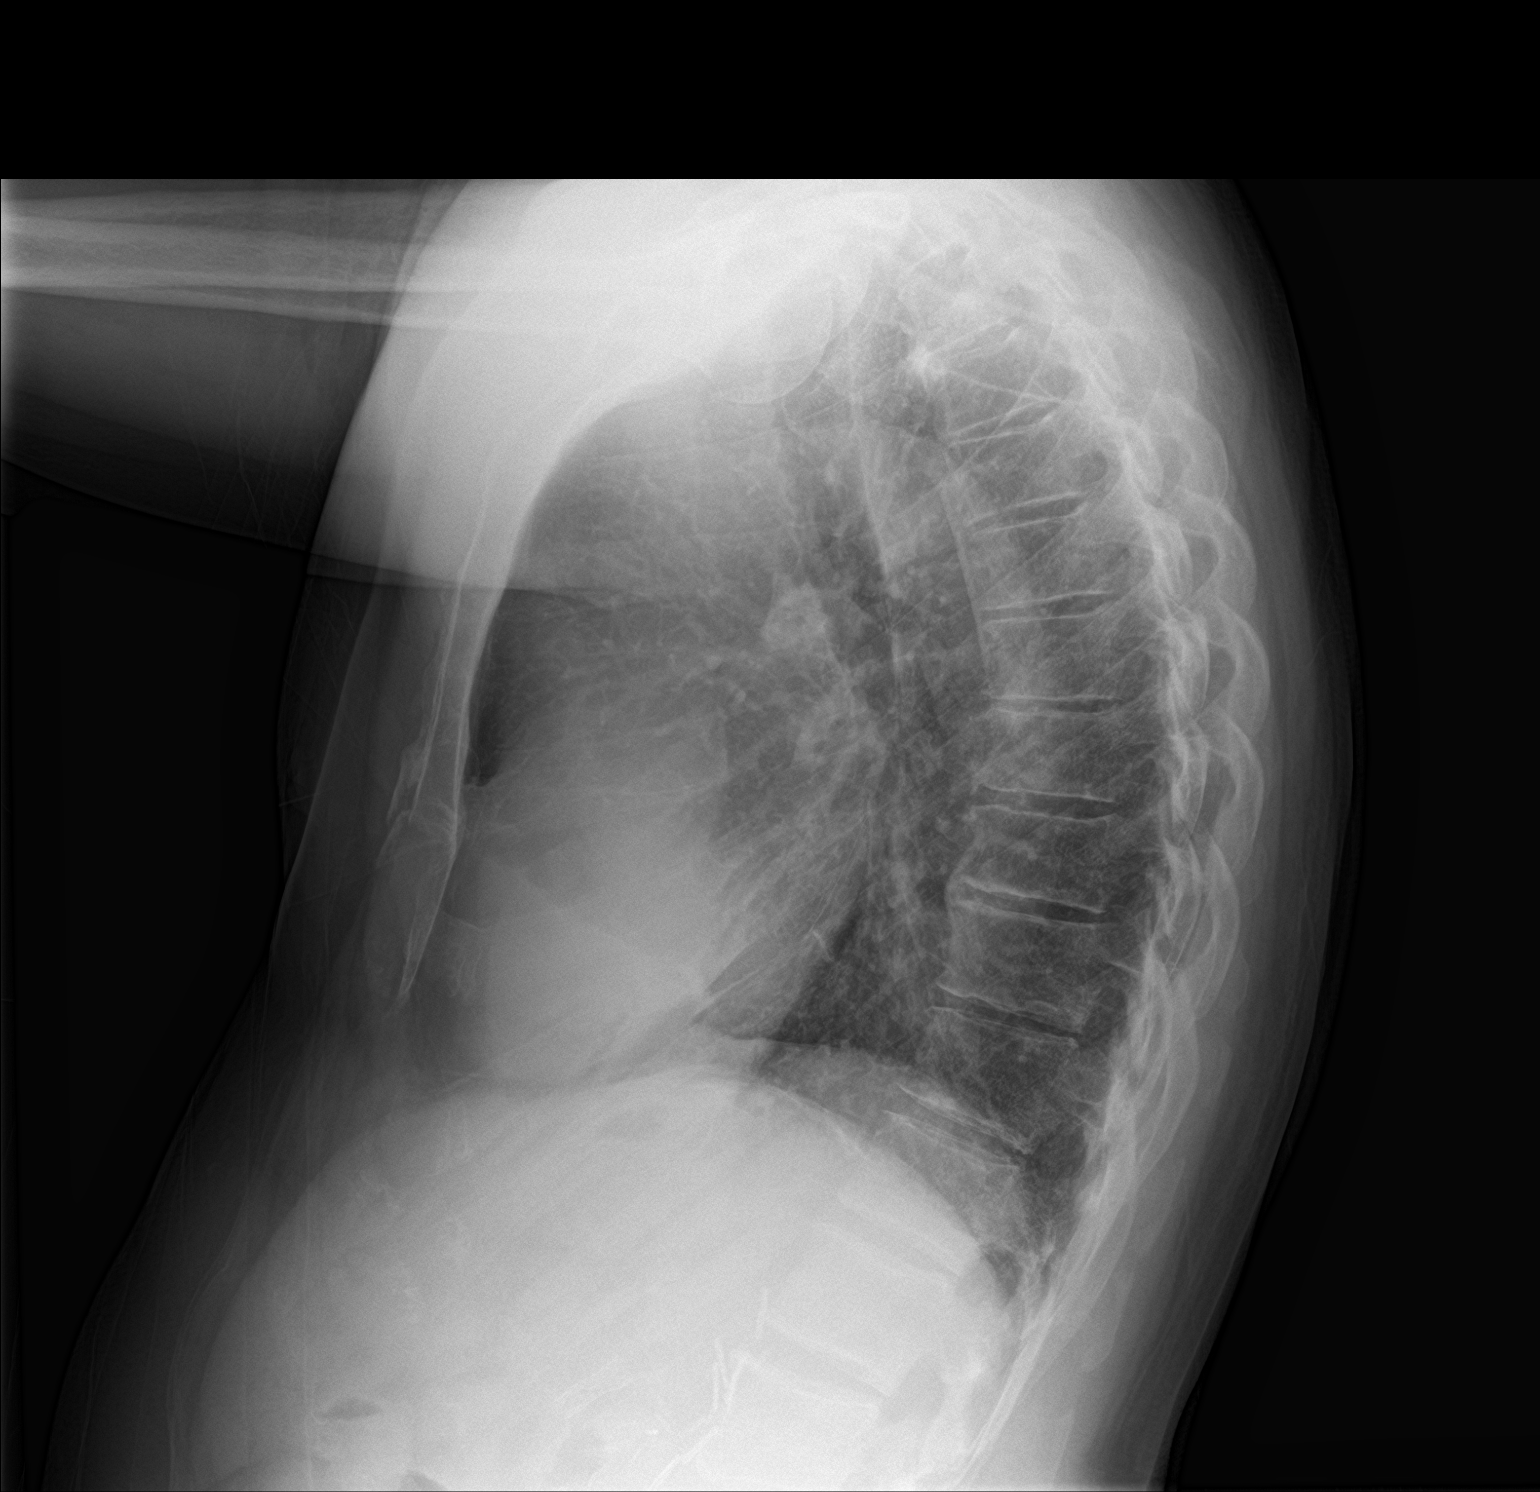

[2 of 2 positions shown; findings below may reference images not displayed]

FINDINGS: Frontal and lateral views of the chest demonstrate stable right
pericardial cyst. Cardiac silhouette is unremarkable. No acute
airspace disease, effusion, or pneumothorax. No acute bony
abnormalities.
IMPRESSION: 1. No acute intrathoracic process.
2. Stable pericardial cyst.

## 2021-06-08 IMAGING — CR DG CHEST 2V
2 series · 2 of 2 positions shown · non-contrast
Comparison: PA and lateral chest 07/24/2019, 10/18/2019 and
04/27/2020. CT chest 07/24/2019.

CLINICAL DATA: Chest pain and shortness of breath.

EXAM:
CHEST - 2 VIEW

[chest lat]
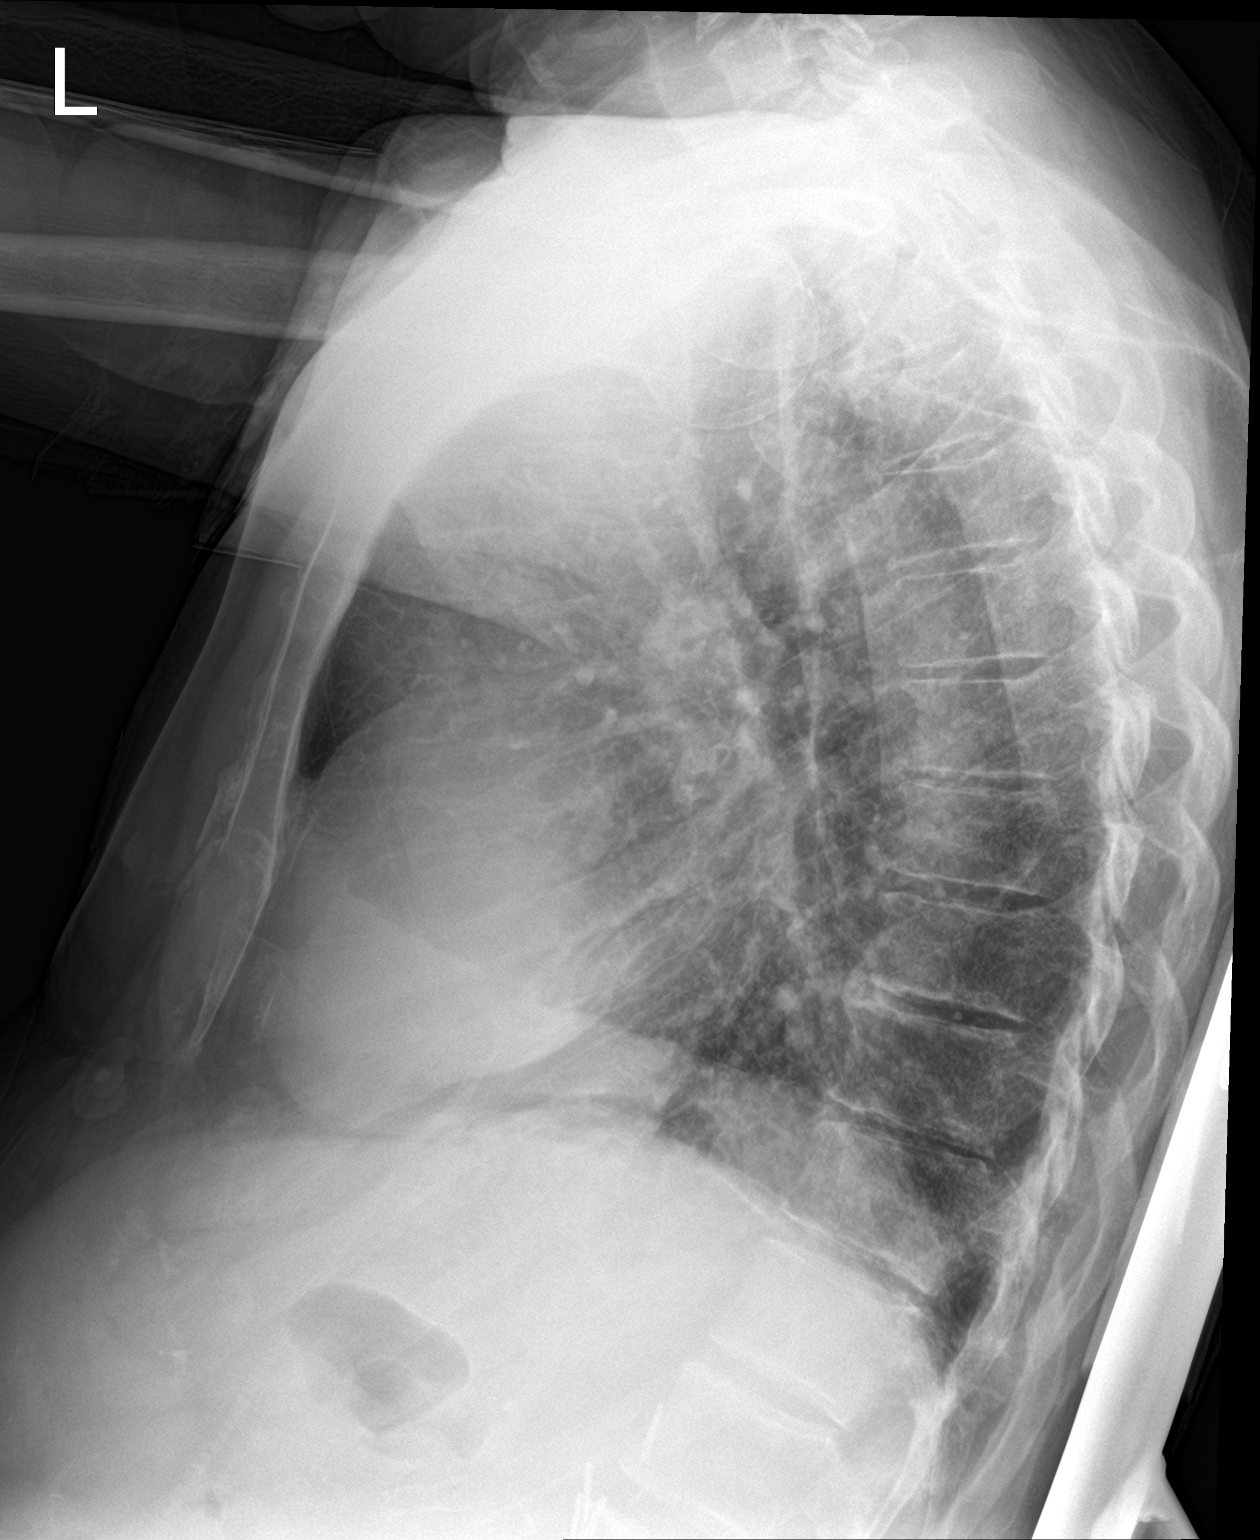

[chest ap]
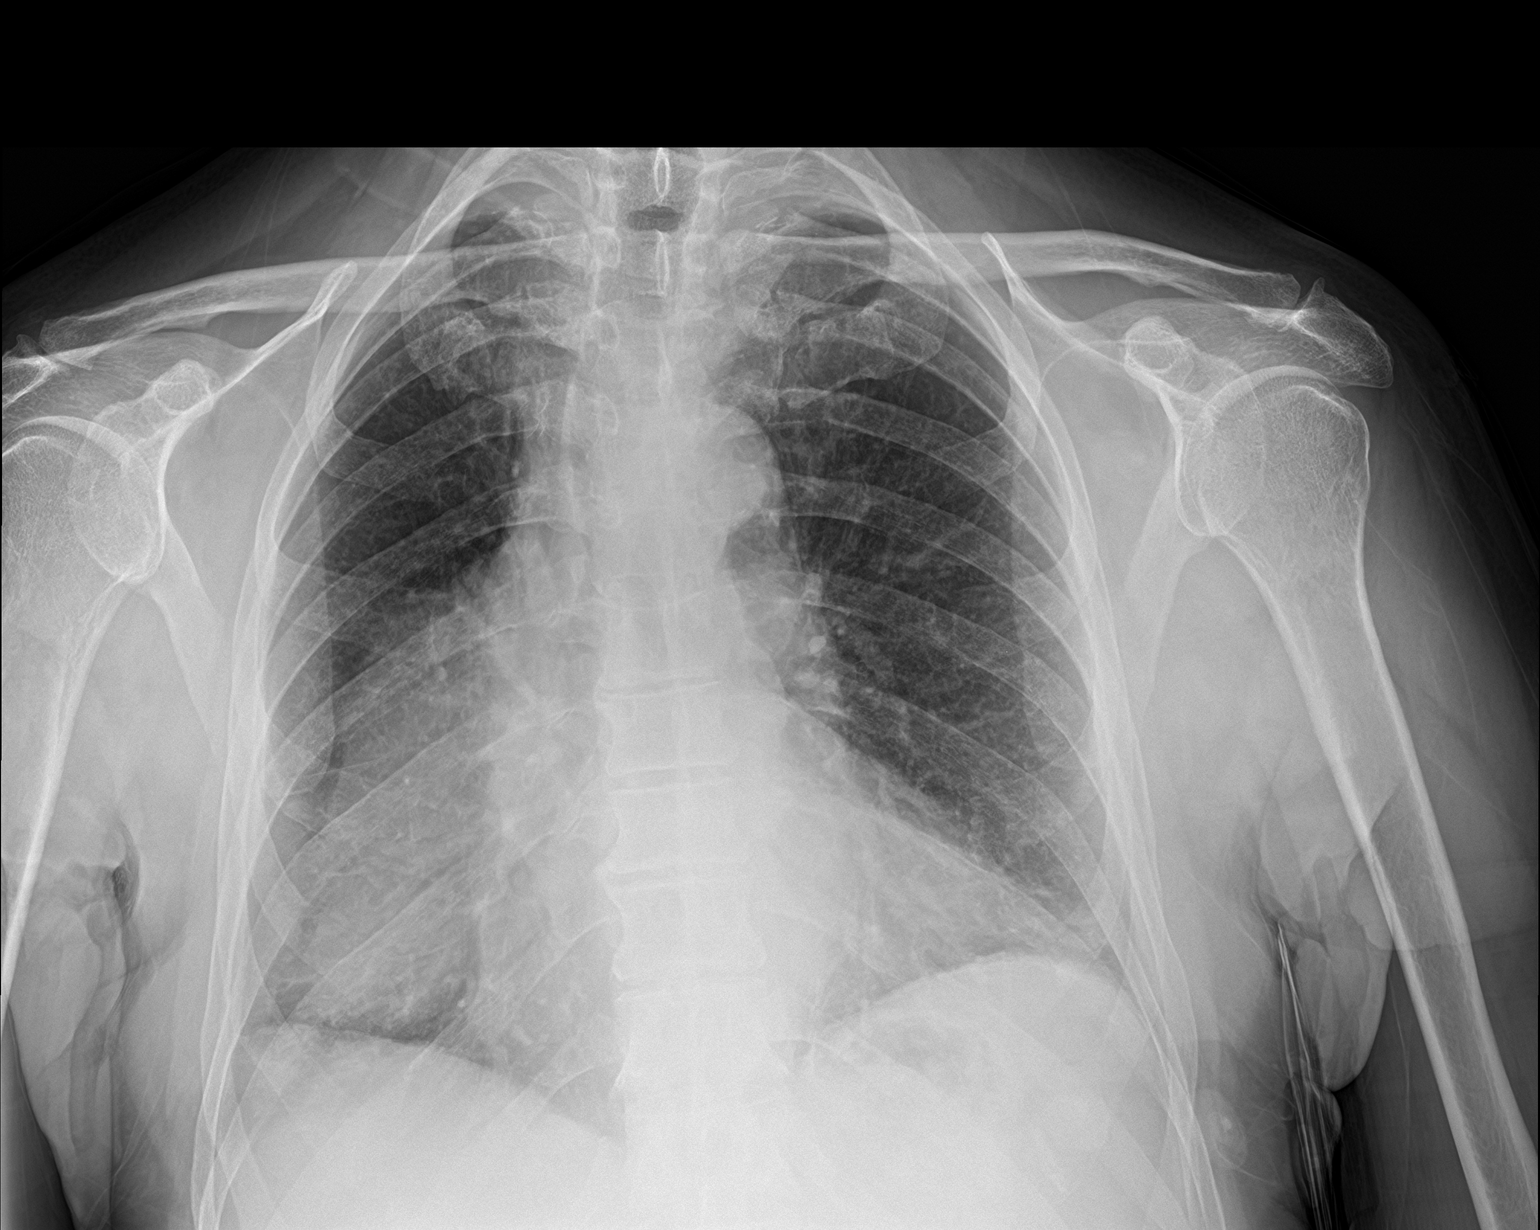

[2 of 2 positions shown; findings below may reference images not displayed]

FINDINGS: Mass in the anterior mediastinum on the right is again seen. The
lesion appears larger than on the most recent comparison likely due
to differences in patient positioning. The lungs are clear. There is
cardiomegaly. No pneumothorax or pleural fluid. No acute or focal
bony abnormality.
IMPRESSION: No acute disease.

Cardiomegaly.

Anterior mediastinal mass is seen on prior examinations is likely a
pericardial cyst and unchanged.

## 2021-07-22 ENCOUNTER — Emergency Department: Payer: Medicare HMO

## 2021-07-22 ENCOUNTER — Emergency Department
Admission: EM | Admit: 2021-07-22 | Discharge: 2021-07-22 | Disposition: A | Discharge status: Home / Self Care | Payer: Medicare HMO | Attending: Student in an Organized Health Care Education/Training Program | Admitting: Student in an Organized Health Care Education/Training Program

## 2021-07-22 ENCOUNTER — Other Ambulatory Visit: Payer: Self-pay

## 2021-07-22 DIAGNOSIS — Z20822 Contact with and (suspected) exposure to covid-19: Secondary | ICD-10-CM | POA: Insufficient documentation

## 2021-07-22 DIAGNOSIS — R051 Acute cough: Secondary | ICD-10-CM

## 2021-07-22 DIAGNOSIS — R0602 Shortness of breath: Secondary | ICD-10-CM | POA: Diagnosis present

## 2021-07-22 DIAGNOSIS — I129 Hypertensive chronic kidney disease with stage 1 through stage 4 chronic kidney disease, or unspecified chronic kidney disease: Secondary | ICD-10-CM | POA: Diagnosis not present

## 2021-07-22 DIAGNOSIS — Z7901 Long term (current) use of anticoagulants: Secondary | ICD-10-CM | POA: Diagnosis not present

## 2021-07-22 DIAGNOSIS — N189 Chronic kidney disease, unspecified: Secondary | ICD-10-CM | POA: Insufficient documentation

## 2021-07-22 DIAGNOSIS — F419 Anxiety disorder, unspecified: Secondary | ICD-10-CM | POA: Diagnosis not present

## 2021-07-22 DIAGNOSIS — J219 Acute bronchiolitis, unspecified: Secondary | ICD-10-CM | POA: Insufficient documentation

## 2021-07-22 DIAGNOSIS — B9789 Other viral agents as the cause of diseases classified elsewhere: Secondary | ICD-10-CM | POA: Insufficient documentation

## 2021-07-22 LAB — RESP PANEL BY RT-PCR (FLU A&B, COVID) ARPGX2
Influenza A by PCR: NEGATIVE
Influenza B by PCR: NEGATIVE
SARS Coronavirus 2 by RT PCR: NEGATIVE

## 2021-07-22 LAB — CBC
HCT: 46.1 % (ref 39.0–52.0)
Hemoglobin: 14.5 g/dL (ref 13.0–17.0)
MCH: 28.3 pg (ref 26.0–34.0)
MCHC: 31.5 g/dL (ref 30.0–36.0)
MCV: 89.9 fL (ref 80.0–100.0)
Platelets: 244 10*3/uL (ref 150–400)
RBC: 5.13 MIL/uL (ref 4.22–5.81)
RDW: 14.5 % (ref 11.5–15.5)
WBC: 6.5 10*3/uL (ref 4.0–10.5)
nRBC: 0 % (ref 0.0–0.2)

## 2021-07-22 LAB — BASIC METABOLIC PANEL
Anion gap: 7 (ref 5–15)
BUN: 23 mg/dL (ref 8–23)
CO2: 28 mmol/L (ref 22–32)
Calcium: 9 mg/dL (ref 8.9–10.3)
Chloride: 104 mmol/L (ref 98–111)
Creatinine, Ser: 1.45 mg/dL — ABNORMAL HIGH (ref 0.61–1.24)
GFR, Estimated: 48 mL/min — ABNORMAL LOW (ref >60)
Glucose, Bld: 117 mg/dL — ABNORMAL HIGH (ref 70–99)
Potassium: 3.9 mmol/L (ref 3.5–5.1)
Sodium: 139 mmol/L (ref 135–145)

## 2021-07-22 LAB — TROPONIN I (HIGH SENSITIVITY): Troponin I (High Sensitivity): 7 ng/L (ref <18)

## 2021-07-22 MED ORDER — PREDNISONE 20 MG PO TABS
60.0000 mg | ORAL_TABLET | Freq: Once | ORAL | Status: AC
  Administered 2021-07-22: 60 mg via ORAL
  Filled 2021-07-22: qty 3

## 2021-07-22 MED ORDER — IPRATROPIUM-ALBUTEROL 0.5-2.5 (3) MG/3ML IN SOLN
3.0000 mL | Freq: Once | RESPIRATORY_TRACT | Status: AC
  Administered 2021-07-22: 3 mL via RESPIRATORY_TRACT
  Filled 2021-07-22: qty 3

## 2021-07-22 MED ORDER — BENZONATATE 100 MG PO CAPS: ORAL_CAPSULE | ORAL | 0 refills | Status: DC

## 2021-07-22 MED ORDER — PREDNISONE 50 MG PO TABS: 50.0000 mg | ORAL_TABLET | Freq: Every day | ORAL | 0 refills | Status: AC

## 2021-07-22 MED ORDER — ALBUTEROL SULFATE HFA 108 (90 BASE) MCG/ACT IN AERS: 2.0000 | INHALATION_SPRAY | Freq: Four times a day (QID) | RESPIRATORY_TRACT | 0 refills | Status: DC | PRN

## 2021-07-22 MED ORDER — IOHEXOL 350 MG/ML SOLN
60.0000 mL | Freq: Once | INTRAVENOUS | Status: AC | PRN
  Administered 2021-07-22: 60 mL via INTRAVENOUS

## 2021-07-22 NOTE — ED Provider Notes (Signed)
? ? ?[facility] ?Emergency Department Provider Note ? ? ? ? Event Date/Time  ? First MD Initiated Contact with Patient 07/22/21 1135   ?  (approximate) ? ? ?History  ? ?Chest Pain ? ? ?HPI ? ?Bradley Ellis is a 81 y.o. male with history of hypertension, bronchitis, A-fib on Eliquis, CKD, and COPD presents to the ED from [facility].  Patient presented there for evaluation after a 1 week complaint of intermittent episodes of chest pain or shortness of breath.  Patient reports associated cough, headache, nausea and vomiting.  He reports similar respiratory symptoms in his wife at home.  Patient denies any frank fevers, chills, sweats, or hemoptysis.  He does report some mild anorexia.  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 ?  ? ? ?Physical Exam  ? ?Triage Vital Signs: ?ED Triage Vitals  ?Enc Vitals Group  ?   BP 07/22/21 1113 125/84  ?   Pulse Rate 07/22/21 1113 70  ?   Resp 07/22/21 1113 18  ?   Temp 07/22/21 1113 98.1 ?F (36.7 ?C)  ?   Temp Source 07/22/21 1113 Oral  ?   SpO2 07/22/21 1113 95 %  ?   Weight 07/22/21 1110 156 lb (70.8 kg)  ?   Height 07/22/21 1110 '5\' 8"'$  (1.727 m)  ?   Head Circumference --   ?   Peak Flow --   ?   Pain Score 07/22/21 1110 6  ?   Pain Loc --   ?   Pain Edu? --   ?   Excl. in [place]? --   ? ? ?Most recent vital signs: ?Vitals:  ? 07/22/21 1330 07/22/21 1345  ?BP: 104/72   ?Pulse: (!) 156 65  ?Resp: 20 17  ?Temp:    ?SpO2:  97%  ? ? ?General Awake, no distress. Alert and engaged ?HEENT NCAT. PERRL. EOMI. No rhinorrhea. Mucous membranes are moist.  ?CV:  Good peripheral perfusion. RRR ?RESP:  Normal effort. Intermittent cough and wet cough noted.  ?ABD:  No distention. Soft, nontender ? ?ED Results / Procedures / Treatments  ? ?Labs ?(all labs ordered are listed, but only abnormal results are displayed) ?Labs Reviewed  ?BASIC METABOLIC PANEL - Abnormal; Notable for the following components:  ?    Result Value  ? Glucose, Bld 117 (*)    ? Creatinine, Ser 1.45 (*)   ? GFR, Estimated 48 (*)   ? All other components within normal limits  ?RESP PANEL BY RT-PCR (FLU A&B, COVID) ARPGX2  ?CBC  ?TROPONIN I (HIGH SENSITIVITY)  ? ? ? ?EKG ? ?Vent. rate 72 BPM ?PR interval 198 ms ?QRS duration 122 ms ?QT/QTcB 400/438 ms ?P-R-T axes 57 105 34 ?RBBB ?No STEMI ? ? ?RADIOLOGY ? ?I personally viewed and evaluated these images as part of my medical decision making, as well as reviewing the written report by the radiologist. ? ?ED Provider Interpretation: no PE or consolidation noted} ? ?DG Chest 2 View ? ?Result Date: 07/22/2021 ?CLINICAL DATA:  Chest pain with congestion. EXAM: CHEST - 2 VIEW COMPARISON:  05/18/2020 FINDINGS: The cardio pericardial silhouette is enlarged. Stable large opacity medial right hemithorax, characterized by CTA chest 07/24/2019 as most consistent with pericardial cyst. The lungs are clear without focal pneumonia, edema, pneumothorax or pleural effusion. Interstitial markings are diffusely coarsened with chronic features. Telemetry leads overlie the chest. IMPRESSION: Stable exam. No acute cardiopulmonary findings. Right pericardial cyst  better characterized on CTA chest 07/24/2019. Electronically Signed   By: Misty Stanley M.D.   On: 07/22/2021 11:55  ? ?CT Angio Chest PE W and/or Wo Contrast ? ?Result Date: 07/22/2021 ?CLINICAL DATA:  Chest pain and shortness of breath for 1 week. Cough. Headache. Nausea and vomiting. On blood thinners. EXAM: CT ANGIOGRAPHY CHEST WITH CONTRAST TECHNIQUE: Multidetector CT imaging of the chest was performed using the standard protocol during bolus administration of intravenous contrast. Multiplanar CT image reconstructions and MIPs were obtained to evaluate the vascular anatomy. RADIATION DOSE REDUCTION: This exam was performed according to the departmental dose-optimization program which includes automated exposure control, adjustment of the mA and/or kV according to patient size and/or use of iterative  reconstruction technique. CONTRAST:  68m OMNIPAQUE IOHEXOL 350 MG/ML SOLN COMPARISON:  Plain film of earlier today.  CTA chest of 07/24/2019 FINDINGS: Cardiovascular: The quality of this exam for evaluation of pulmonary embolism is good. No evidence of pulmonary embolism. Aortic atherosclerosis. Tortuous thoracic aorta. Mild cardiomegaly, without pericardial effusion. Mediastinum/Nodes: Prominent prevascular node is similar at 6 mm and likely reactive. No middle mediastinal or hilar adenopathy. Cystic lesion within the right cardiophrenic space measures on the order of 7.9 x 3.0 cm on 83/4. Compare 6.4 x 3.6 cm on 07/24/2019 when measured in a similar fashion. Lungs/Pleura: No pleural fluid. Right apical 3 mm nodule on 23/5 is unchanged and considered benign. Again identified is hypoventilation with areas of subsegmental atelectasis at the lung bases. Volume loss is also seen adjacent to the above-described pericardial cyst. Mild diffuse mosaic attenuation is favored to be related to air trapping in the setting of hypoventilation. Upper Abdomen: Normal imaged portions of the liver, spleen, stomach, pancreas. Musculoskeletal: No acute osseous abnormality. Review of the MIP images confirms the above findings. IMPRESSION: 1. No evidence of pulmonary embolism. No explanation for patient's symptoms. 2. Enlargement of a cystic right cardiophrenic angle mass since 07/24/2019, most consistent with pericardial cyst. 3.  Aortic Atherosclerosis (ICD10-I70.0). Electronically Signed   By: KAbigail MiyamotoM.D.   On: 07/22/2021 14:23   ? ? ?PROCEDURES: ? ?Critical Care performed: No ? ?Procedures ? ? ?MEDICATIONS ORDERED IN ED: ?Medications  ?predniSONE (DELTASONE) tablet 60 mg (has no administration in time range)  ?ipratropium-albuterol (DUONEB) 0.5-2.5 (3) MG/3ML nebulizer solution 3 mL (has no administration in time range)  ?ipratropium-albuterol (DUONEB) 0.5-2.5 (3) MG/3ML nebulizer solution 3 mL (3 mLs Nebulization Given  07/22/21 1222)  ?iohexol (OMNIPAQUE) 350 MG/ML injection 60 mL (60 mLs Intravenous Contrast Given 07/22/21 1351)  ? ? ? ?IMPRESSION / MDM / ASSESSMENT AND PLAN / ED COURSE  ?I reviewed the triage vital signs and the nursing notes. ?             ?               ? ?Differential diagnosis includes, but is not limited to, viral syndrome, bronchitis including COPD exacerbation, pneumonia, reactive airway disease including asthma, CHF including exacerbation with or without pulmonary/interstitial edema, pneumothorax, ACS, thoracic trauma, and pulmonary embolism. ? ?The patient is on the cardiac monitor to evaluate for evidence of arrhythmia and/or significant heart rate changes. ? ?Geriatric patient to the ED for evaluation of 1 week complaint of cough, intermittent chest pain, and shortness of breath.  Patient presents after home medications for cough and congestion have failed.  He presented to his primary provider's office today for further evaluation, was referred to the ED for work-up.  Patient presents afebrile without respiratory distress,  dehydration, or toxic appearance.  Normal oxygen saturation on room air, no signs of tachycardia.  Patient's work-up is overall reassuring.  No radiologic evidence of any acute intracranial process on initial plain films.  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  Given patient's ongoing symptoms, CT angio was ordered.  It is negative at this time for acute PE or pneumonia.  Stable progress of cyst is again noted.  Patient's viral panel screen is negative at this time, troponin is negative x1.  Patient without complaints of 1 week of symptoms without substernal chest pain, diaphoresis, or exertional nature lessens the likelihood of ACS. Patient's diagnosis is consistent with bronchitis due to viral etiology.  Patient is stable for outpatient management at this time.  Patient will be discharged home with prescriptions for prednisone, tessalon Perles, and albuterol.  inhaler. Patient is to follow up with his PCP as needed or otherwise directed. Patient is given ED precautions to return to the ED for any worsening or new symptoms. ? ? ?FINAL CLINICAL IMPRESSION(S) / ED

## 2021-07-22 NOTE — Discharge Instructions (Addendum)
Your exam, labs, and CT are all normal and reassuring at this time.  No signs of a serious infection to the lungs or significant change in your previously diagnosed lung cyst.  Take prescription meds as directed.  Follow-up with your primary provider or return to the ED if needed. ?

## 2021-07-22 NOTE — ED Triage Notes (Signed)
Pt to ED from [facility] c/o CP and SOB x1 week. Pt also reports cough, HA, nausea and vomiting. Pt states wife is currently sick at home. Pt currently on eliquis. Pt with hx HTN. ?

## 2021-07-22 NOTE — ED Notes (Addendum)
FIRST NURSE NOTE: Pt c/o CP, SOB, cough, and congestion from [facility]. Pt has a hx blockage. Pt states it has been going on for a week. ?

## 2021-10-13 ENCOUNTER — Emergency Department: Payer: Medicare HMO

## 2021-10-13 ENCOUNTER — Other Ambulatory Visit: Payer: Self-pay

## 2021-10-13 ENCOUNTER — Observation Stay
Admission: EM | Admit: 2021-10-13 | Discharge: 2021-10-14 | Disposition: A | Discharge status: Home / Self Care | Payer: Medicare HMO | Attending: Internal Medicine | Admitting: Internal Medicine

## 2021-10-13 DIAGNOSIS — E876 Hypokalemia: Secondary | ICD-10-CM | POA: Diagnosis not present

## 2021-10-13 DIAGNOSIS — I10 Essential (primary) hypertension: Secondary | ICD-10-CM | POA: Insufficient documentation

## 2021-10-13 DIAGNOSIS — G5793 Unspecified mononeuropathy of bilateral lower limbs: Secondary | ICD-10-CM | POA: Insufficient documentation

## 2021-10-13 DIAGNOSIS — Z85528 Personal history of other malignant neoplasm of kidney: Secondary | ICD-10-CM | POA: Insufficient documentation

## 2021-10-13 DIAGNOSIS — Z85828 Personal history of other malignant neoplasm of skin: Secondary | ICD-10-CM | POA: Insufficient documentation

## 2021-10-13 DIAGNOSIS — Z79899 Other long term (current) drug therapy: Secondary | ICD-10-CM | POA: Diagnosis not present

## 2021-10-13 DIAGNOSIS — I480 Paroxysmal atrial fibrillation: Secondary | ICD-10-CM | POA: Insufficient documentation

## 2021-10-13 DIAGNOSIS — R55 Syncope and collapse: Principal | ICD-10-CM | POA: Diagnosis present

## 2021-10-13 DIAGNOSIS — Z7901 Long term (current) use of anticoagulants: Secondary | ICD-10-CM | POA: Insufficient documentation

## 2021-10-13 DIAGNOSIS — I25118 Atherosclerotic heart disease of native coronary artery with other forms of angina pectoris: Secondary | ICD-10-CM | POA: Insufficient documentation

## 2021-10-13 DIAGNOSIS — Z7982 Long term (current) use of aspirin: Secondary | ICD-10-CM | POA: Insufficient documentation

## 2021-10-13 DIAGNOSIS — D649 Anemia, unspecified: Secondary | ICD-10-CM | POA: Insufficient documentation

## 2021-10-13 DIAGNOSIS — J449 Chronic obstructive pulmonary disease, unspecified: Secondary | ICD-10-CM | POA: Diagnosis not present

## 2021-10-13 DIAGNOSIS — Z7902 Long term (current) use of antithrombotics/antiplatelets: Secondary | ICD-10-CM | POA: Diagnosis not present

## 2021-10-13 DIAGNOSIS — K380 Hyperplasia of appendix: Secondary | ICD-10-CM | POA: Insufficient documentation

## 2021-10-13 DIAGNOSIS — Z96622 Presence of left artificial elbow joint: Secondary | ICD-10-CM | POA: Insufficient documentation

## 2021-10-13 DIAGNOSIS — I318 Other specified diseases of pericardium: Secondary | ICD-10-CM | POA: Insufficient documentation

## 2021-10-13 DIAGNOSIS — Z902 Acquired absence of lung [part of]: Secondary | ICD-10-CM | POA: Diagnosis not present

## 2021-10-13 LAB — URINALYSIS, ROUTINE W REFLEX MICROSCOPIC
Bilirubin Urine: NEGATIVE
Glucose, UA: NEGATIVE mg/dL
Hgb urine dipstick: NEGATIVE
Ketones, ur: NEGATIVE mg/dL
Leukocytes,Ua: NEGATIVE
Nitrite: NEGATIVE
Protein, ur: NEGATIVE mg/dL
Specific Gravity, Urine: 1.021 (ref 1.005–1.030)
pH: 5 (ref 5.0–8.0)

## 2021-10-13 LAB — HEPATIC FUNCTION PANEL
ALT: 11 U/L (ref 0–44)
AST: 18 U/L (ref 15–41)
Albumin: 3.3 g/dL — ABNORMAL LOW (ref 3.5–5.0)
Alkaline Phosphatase: 65 U/L (ref 38–126)
Bilirubin, Direct: 0.2 mg/dL (ref 0.0–0.2)
Indirect Bilirubin: 0.4 mg/dL (ref 0.3–0.9)
Total Bilirubin: 0.6 mg/dL (ref 0.3–1.2)
Total Protein: 6 g/dL — ABNORMAL LOW (ref 6.5–8.1)

## 2021-10-13 LAB — BASIC METABOLIC PANEL
Anion gap: 3 — ABNORMAL LOW (ref 5–15)
BUN: 15 mg/dL (ref 8–23)
CO2: 27 mmol/L (ref 22–32)
Calcium: 7.9 mg/dL — ABNORMAL LOW (ref 8.9–10.3)
Chloride: 111 mmol/L (ref 98–111)
Creatinine, Ser: 0.98 mg/dL (ref 0.61–1.24)
GFR, Estimated: >60 mL/min (ref >60)
Glucose, Bld: 101 mg/dL — ABNORMAL HIGH (ref 70–99)
Potassium: 3.1 mmol/L — ABNORMAL LOW (ref 3.5–5.1)
Sodium: 141 mmol/L (ref 135–145)

## 2021-10-13 LAB — CBC
HCT: 38.8 % — ABNORMAL LOW (ref 39.0–52.0)
Hemoglobin: 11.9 g/dL — ABNORMAL LOW (ref 13.0–17.0)
MCH: 28.1 pg (ref 26.0–34.0)
MCHC: 30.7 g/dL (ref 30.0–36.0)
MCV: 91.7 fL (ref 80.0–100.0)
Platelets: 218 10*3/uL (ref 150–400)
RBC: 4.23 MIL/uL (ref 4.22–5.81)
RDW: 14.3 % (ref 11.5–15.5)
WBC: 8.9 10*3/uL (ref 4.0–10.5)
nRBC: 0 % (ref 0.0–0.2)

## 2021-10-13 LAB — TROPONIN I (HIGH SENSITIVITY)
Troponin I (High Sensitivity): 40 ng/L — ABNORMAL HIGH (ref <18)
Troponin I (High Sensitivity): 40 ng/L — ABNORMAL HIGH (ref <18)

## 2021-10-13 LAB — TYPE AND SCREEN
ABO/RH(D): O POS
Antibody Screen: NEGATIVE

## 2021-10-13 LAB — LIPASE, BLOOD: Lipase: 37 U/L (ref 11–51)

## 2021-10-13 MED ORDER — FAMOTIDINE 20 MG PO TABS
20.0000 mg | ORAL_TABLET | Freq: Two times a day (BID) | ORAL | Status: DC
  Administered 2021-10-13: 20 mg via ORAL
  Filled 2021-10-13: qty 1

## 2021-10-13 MED ORDER — ALBUTEROL SULFATE (2.5 MG/3ML) 0.083% IN NEBU: 3.0000 mL | INHALATION_SOLUTION | Freq: Four times a day (QID) | RESPIRATORY_TRACT | Status: DC | PRN

## 2021-10-13 MED ORDER — SODIUM CHLORIDE 0.9 % IV SOLN: INTRAVENOUS | Status: DC

## 2021-10-13 MED ORDER — ONDANSETRON HCL 4 MG/2ML IJ SOLN: 4.0000 mg | Freq: Four times a day (QID) | INTRAMUSCULAR | Status: DC | PRN

## 2021-10-13 MED ORDER — TRAZODONE HCL 50 MG PO TABS
50.0000 mg | ORAL_TABLET | Freq: Every day | ORAL | Status: DC
Start: 2021-10-13 — End: 2021-10-13

## 2021-10-13 MED ORDER — ATORVASTATIN CALCIUM 20 MG PO TABS
80.0000 mg | ORAL_TABLET | Freq: Every day | ORAL | Status: DC
  Administered 2021-10-13: 80 mg via ORAL
  Filled 2021-10-13: qty 4

## 2021-10-13 MED ORDER — ACETAMINOPHEN 650 MG RE SUPP: 650.0000 mg | Freq: Four times a day (QID) | RECTAL | Status: DC | PRN

## 2021-10-13 MED ORDER — ACETAMINOPHEN 325 MG PO TABS
650.0000 mg | ORAL_TABLET | Freq: Four times a day (QID) | ORAL | Status: DC | PRN
  Administered 2021-10-13: 650 mg via ORAL
  Filled 2021-10-13: qty 2

## 2021-10-13 MED ORDER — ASPIRIN 81 MG PO TBEC: 81.0000 mg | DELAYED_RELEASE_TABLET | Freq: Every day | ORAL | Status: DC

## 2021-10-13 MED ORDER — LOPERAMIDE HCL 2 MG PO CAPS: 2.0000 mg | ORAL_CAPSULE | Freq: Four times a day (QID) | ORAL | Status: DC | PRN

## 2021-10-13 MED ORDER — POLYVINYL ALCOHOL 1.4 % OP SOLN: 1.0000 [drp] | Freq: Three times a day (TID) | OPHTHALMIC | Status: DC | PRN

## 2021-10-13 MED ORDER — TRAZODONE HCL 100 MG PO TABS
100.0000 mg | ORAL_TABLET | Freq: Every day | ORAL | Status: DC
  Administered 2021-10-14: 100 mg via ORAL
  Filled 2021-10-13 (×2): qty 1

## 2021-10-13 MED ORDER — IOHEXOL 350 MG/ML SOLN
100.0000 mL | Freq: Once | INTRAVENOUS | Status: AC | PRN
  Administered 2021-10-13: 100 mL via INTRAVENOUS

## 2021-10-13 MED ORDER — POTASSIUM CHLORIDE CRYS ER 20 MEQ PO TBCR
40.0000 meq | EXTENDED_RELEASE_TABLET | Freq: Once | ORAL | Status: AC
  Administered 2021-10-13: 40 meq via ORAL
  Filled 2021-10-13: qty 2

## 2021-10-13 MED ORDER — BENZONATATE 100 MG PO CAPS: 100.0000 mg | ORAL_CAPSULE | Freq: Three times a day (TID) | ORAL | Status: DC | PRN

## 2021-10-13 MED ORDER — GABAPENTIN 300 MG PO CAPS: 300.0000 mg | ORAL_CAPSULE | Freq: Every day | ORAL | Status: DC

## 2021-10-13 MED ORDER — TRAMADOL HCL 50 MG PO TABS: 50.0000 mg | ORAL_TABLET | Freq: Four times a day (QID) | ORAL | Status: DC | PRN

## 2021-10-13 MED ORDER — ENSURE ENLIVE PO LIQD
237.0000 mL | Freq: Two times a day (BID) | ORAL | Status: DC
Start: 2021-10-14 — End: 2021-10-14

## 2021-10-13 MED ORDER — PANTOPRAZOLE SODIUM 40 MG PO TBEC
40.0000 mg | DELAYED_RELEASE_TABLET | Freq: Every day | ORAL | Status: DC
  Administered 2021-10-13: 40 mg via ORAL
  Filled 2021-10-13: qty 1

## 2021-10-13 MED ORDER — ONDANSETRON HCL 4 MG PO TABS: 4.0000 mg | ORAL_TABLET | Freq: Four times a day (QID) | ORAL | Status: DC | PRN

## 2021-10-13 MED ORDER — CYCLOBENZAPRINE HCL 10 MG PO TABS
10.0000 mg | ORAL_TABLET | Freq: Three times a day (TID) | ORAL | Status: DC | PRN
Start: 2021-10-13 — End: 2021-10-14

## 2021-10-13 MED ORDER — METOPROLOL TARTRATE 25 MG PO TABS
75.0000 mg | ORAL_TABLET | Freq: Two times a day (BID) | ORAL | Status: DC
  Filled 2021-10-13: qty 1

## 2021-10-13 MED ORDER — APIXABAN 5 MG PO TABS
5.0000 mg | ORAL_TABLET | Freq: Two times a day (BID) | ORAL | Status: DC
  Administered 2021-10-13: 5 mg via ORAL
  Filled 2021-10-13: qty 1

## 2021-10-13 MED ORDER — SODIUM CHLORIDE 0.9 % IV BOLUS
500.0000 mL | Freq: Once | INTRAVENOUS | Status: AC
  Administered 2021-10-13: 500 mL via INTRAVENOUS

## 2021-10-13 MED ORDER — ROPINIROLE HCL 0.25 MG PO TABS
0.2500 mg | ORAL_TABLET | Freq: Every day | ORAL | Status: DC
  Filled 2021-10-13: qty 1

## 2021-10-13 NOTE — Consult Note (Addendum)
Subjective:   CC: Enlarged appendiceal tip  HPI:  Bradley Ellis is a 81 y.o. male who was consulted by Quentin Cornwall for evaluation of above.    Patient presents for syncopal episode and work-up for that including a CT abdomen pelvis noted the incidental finding above.  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  Patient also has chronic diarrhea issues as well.  He does not report any acute changes with the symptoms in the past several weeks to months.  The GI issues started several years ago.  Patient denies any other concerns, is tolerating oral intake without any issues as well.    Past Medical History:  has a past medical history of Chronic bronchitis ([place]), Depression (1961), GERD (gastroesophageal reflux disease), Hyperlipidemia, Hypertension, Laryngeal nodule, and Sleep disorder.  Past Surgical History:  has a past surgical history that includes Nephrectomy (Right, 1998); Cervical discectomy (4580,9983); Knee arthroscopy (Left); Breast cyst excision (Bilateral); Tonsillectomy and adenoidectomy (1962); Elbow Arthroplasty (Left, 1962); Skin cancer excision; and Prostate surgery.  Family History: family history includes Cancer in his mother; Diabetes in his maternal grandmother and sister; Heart disease in his sister; Stroke in his sister.  Social History:  reports that he has never smoked. He has never used smokeless tobacco. He reports that he does not drink alcohol and does not use drugs.  Current Medications:  Prior to Admission medications   Medication Sig Start Date End Date Taking? Authorizing Provider  atorvastatin (LIPITOR) 80 MG tablet Take 80 mg by mouth daily. 01/25/21  Yes [provider]  cyclobenzaprine (FLEXERIL) 10 MG tablet Take 10 mg by mouth 3 (three) times daily as needed for muscle spasms. 10/03/21  Yes [provider]  ELIQUIS 5 MG TABS tablet Take 5 mg by mouth 2 (two)  times daily. 06/09/21  Yes [provider]  famotidine (PEPCID) 20 MG tablet Take 20 mg by mouth 2 (two) times daily. 06/09/21  Yes [provider]  gabapentin (NEURONTIN) 300 MG capsule Take 1 capsule (300 mg total) by mouth at bedtime. 08/15/19  Yes Sowles, Drue Stager, MD  irbesartan-hydrochlorothiazide (AVALIDE) 300-12.5 MG tablet Take 1 tablet by mouth once daily 02/06/20  Yes Sowles, Drue Stager, MD  isosorbide mononitrate (IMDUR) 30 MG 24 hr tablet Take 90 mg by mouth every morning. 06/09/21  Yes [provider]  metoprolol tartrate (LOPRESSOR) 50 MG tablet Take 75 mg by mouth 2 (two) times daily. 06/09/21  Yes [provider]  omeprazole (PRILOSEC) 20 MG capsule Take 20 mg by mouth daily. 10/04/21  Yes [provider]  tadalafil (CIALIS) 20 MG tablet Take 20 mg by mouth every morning. 08/17/21  Yes [provider]  traZODone (DESYREL) 100 MG tablet Take 100 mg by mouth at bedtime. 09/16/21  Yes [provider]  albuterol (VENTOLIN HFA) 108 (90 Base) MCG/ACT inhaler Inhale 2 puffs into the lungs every 6 (six) hours as needed for shortness of breath. Patient not taking: Reported on 10/13/2021 07/22/21   Menshew, Dannielle Karvonen, PA-C  aspirin EC 81 MG tablet Take 81 mg by mouth daily. Patient not taking: Reported on 10/13/2021    [provider]  benzonatate (TESSALON PERLES) 100 MG capsule Take 1-2 tabs TID prn cough Patient not taking: Reported on 10/13/2021 07/22/21   Menshew, Dannielle Karvonen, PA-C  carboxymethylcellulose (REFRESH PLUS) 0.5 % SOLN Place 1 drop into both eyes 3 (three) times daily as needed.    [provider]  furosemide (LASIX) 40 MG tablet Take 40 mg by mouth as needed for fluid. Patient not taking: Reported on 10/13/2021    [provider]  loperamide (IMODIUM) 2 MG capsule  02/20/20   [provider]  loratadine (CLARITIN) 10 MG tablet Take 10 mg by mouth daily. Patient not taking: Reported on 10/13/2021     [provider]  nitroGLYCERIN (NITROSTAT) 0.4 MG SL tablet SMARTSIG:1 Tablet(s) Sublingual PRN 02/21/21   [provider]  rOPINIRole (REQUIP) 0.25 MG tablet Take 1 tablet by mouth at bedtime. Patient not taking: Reported on 10/13/2021 07/04/21   [provider]  Yohimbe Bark 500 MG CAPS Take 1 capsule by mouth 2 (two) times daily. Patient not taking: Reported on 10/13/2021    [provider]    Allergies:  Allergies  Allergen Reactions   Sulfa Antibiotics Rash and Hives   Gabapentin Other (See Comments)    Dizziness Patient can tolerate sparingly and only as needed    ROS:  General: Denies weight loss, weight gain, fatigue, fevers, chills, and night sweats. Eyes: Denies blurry vision, double vision, eye pain, itchy eyes, and tearing. Ears: Denies hearing loss, earache, and ringing in ears. Nose: Denies sinus pain, congestion, infections, runny nose, and nosebleeds. Mouth/throat: Denies hoarseness, sore throat, bleeding gums, and difficulty swallowing. Heart: Denies chest pain, palpitations, racing heart, irregular heartbeat, leg pain or swelling, and decreased activity tolerance. Respiratory: Denies breathing difficulty, shortness of breath, wheezing, cough, and sputum. GI: Denies change in appetite, heartburn, nausea, vomiting, constipation, and blood in stool. GU: Denies difficulty urinating, pain with urinating, urgency, frequency, blood in urine. Musculoskeletal: Denies joint stiffness, pain, swelling, muscle weakness. Skin: Denies rash, itching, mass, tumors, sores, and boils Neurologic: Denies headache, fainting, dizziness, seizures, numbness, and tingling. Psychiatric: Denies depression, anxiety, difficulty sleeping, and memory loss. Endocrine: Denies heat or cold intolerance, and increased thirst or urination. Blood/lymph: Denies easy bruising, easy bruising, and swollen glands     Objective:     BP (!) 109/94   Pulse 73   Temp 97.7  F (36.5 C) (Oral)   Resp (!) 21   SpO2 95%   Constitutional :  alert, cooperative, appears stated age, and no distress  Lymphatics/Throat:  no asymmetry, masses, or scars  Respiratory:  clear to auscultation bilaterally  Cardiovascular:  regular rate and rhythm  Gastrointestinal: Soft, no guarding, minimal tenderness to palpation in suprapubic region, reportedly chronic.  Repeat examination noted the tenderness to palpation resolved. .  Musculoskeletal: Steady gait and movement  Skin: Cool and moist  Psychiatric: Normal affect, non-agitated, not confused       LABS:     Latest Ref Rng & Units 10/13/2021    4:01 PM 10/13/2021    2:12 PM 07/22/2021   11:13 AM  CMP  Glucose 70 - 99 mg/dL  101  117   BUN 8 - 23 mg/dL  15  23   Creatinine 0.61 - 1.24 mg/dL  0.98  1.45   Sodium 135 - 145 mmol/L  141  139   Potassium 3.5 - 5.1 mmol/L  3.1  3.9   Chloride 98 - 111 mmol/L  111  104   CO2 22 - 32 mmol/L  27  28   Calcium 8.9 - 10.3 mg/dL  7.9  9.0   Total Protein 6.5 - 8.1 g/dL 6.0     Total Bilirubin 0.3 - 1.2 mg/dL 0.6     Alkaline Phos 38 - 126 U/L 65  AST 15 - 41 U/L 18     ALT 0 - 44 U/L 11         Latest Ref Rng & Units 10/13/2021    2:12 PM 07/22/2021   11:13 AM 05/18/2020    9:53 AM  CBC  WBC 4.0 - 10.5 K/uL 8.9  6.5  6.8   Hemoglobin 13.0 - 17.0 g/dL 11.9  14.5  13.2   Hematocrit 39.0 - 52.0 % 38.8  46.1  41.3   Platelets 150 - 400 K/uL 218  244  235     RADS: CLINICAL DATA:  Aortic aneurysm, known or suspected.  Syncope   EXAM: CT ANGIOGRAPHY CHEST, ABDOMEN AND PELVIS   TECHNIQUE: Non-contrast CT of the chest was initially obtained.   Multidetector CT imaging through the chest, abdomen and pelvis was performed using the standard protocol during bolus administration of intravenous contrast. Multiplanar reconstructed images and MIPs were obtained and reviewed to evaluate the vascular anatomy.   RADIATION DOSE REDUCTION: This exam was performed according to  the departmental dose-optimization program which includes automated exposure control, adjustment of the mA and/or kV according to patient size and/or use of iterative reconstruction technique.   CONTRAST:  1108m OMNIPAQUE IOHEXOL 350 MG/ML SOLN   COMPARISON:  CT angiography chest 07/22/2021, MRI abdomen 06/25/2018   FINDINGS: CTA CHEST FINDINGS   Cardiovascular:   Preferential opacification of the thoracic aorta. No evidence of thoracic aortic aneurysm or dissection. Normal heart size. No pericardial effusion.   No central or segmental pulmonary embolus. The main pulmonary artery measures at the upper limits of normal.   Mediastinum/Nodes: Similar-appearing fluid density 7 x 5 cm lesion within the cardio phrenic angle likely representing a pericardial cyst. No enlarged mediastinal, hilar, or axillary lymph nodes. Thyroid gland, trachea, and esophagus demonstrate no significant findings.   Lungs/Pleura: Right middle lobe passive atelectasis as well as anterior right upper lobe passive atelectasis due to a large pericardial cyst. No focal consolidation. Couple scattered calcified and noncalcified pulmonary micronodules. No new pulmonary nodule. No pulmonary mass. No pleural effusion. No pneumothorax.   Musculoskeletal:   No chest wall abnormality.   No suspicious lytic or blastic osseous lesions. No acute displaced fracture.   Review of the MIP images confirms the above findings.   CTA ABDOMEN AND PELVIS FINDINGS   VASCULAR   Aorta: Interval increase in caliber of an infrarenal abdominal aorta measuring up to 3.5 x 3.7 cm (from 3.3 x 3.1 cm) and extending 5 cm in the craniocaudal dimension. No aortic fat stranding or draping over the lumbar spine. Atherosclerotic plaque. No dissection, vasculitis or significant stenosis.   Celiac: Atherosclerotic plaque at the origin. Patent without evidence of aneurysm, dissection, vasculitis or significant stenosis.   SMA:  Patent without evidence of aneurysm, dissection, vasculitis or significant stenosis.   Renals: Both renal arteries are patent without evidence of aneurysm, dissection, vasculitis, fibromuscular dysplasia or significant stenosis.   IMA: Atherosclerotic plaque at the origin. Patent without evidence of aneurysm, dissection, vasculitis or significant stenosis.   Inflow: Mild atherosclerotic plaque. Patent without evidence of aneurysm, dissection, vasculitis or significant stenosis.   Veins: No obvious venous abnormality within the limitations of this arterial phase study.   Review of the MIP images confirms the above findings.   NON-VASCULAR   Hepatobiliary: No focal liver abnormality. No gallstones, gallbladder wall thickening, or pericholecystic fluid. No biliary dilatation.   Pancreas: No focal lesion. Normal pancreatic contour. No surrounding inflammatory changes. No main pancreatic ductal  dilatation.   Spleen: Normal in size without focal abnormality.   Adrenals/Urinary Tract:   No adrenal nodule on the left. The right adrenal gland may be surgically absent.   Left kidney enhances homogeneous lead. Status post right nephrectomy.   No hydronephrosis. No hydroureter.   The urinary bladder is unremarkable.   Stomach/Bowel: Stomach is within normal limits. No evidence of bowel wall thickening or dilatation. Enlarged appendiceal tip with query mild periappendiceal fat stranding.   Lymphatic: No lymphadenopathy.   Reproductive: Prostate is unremarkable.   Other: No intraperitoneal free fluid. No intraperitoneal free gas. No organized fluid collection.   Musculoskeletal:   No abdominal wall hernia or abnormality.   No suspicious lytic or blastic osseous lesions. No acute displaced fracture.   Review of the MIP images confirms the above findings.   IMPRESSION: 1. Enlarged appendiceal tip (13 mm). No associated inflammatory changes to suggest acute appendicitis.  Finding concerning for appendiceal mucocele or malignancy. Recommend surgical consultation for appendectomy. 2. Interval increase in caliber of an infrarenal abdominal aorta measuring up to 3.5 x 3.7 cm (from 3.3 x 3.1 cm) and extending 5 cm in the craniocaudal dimension. Recommend follow-up ultrasound every 2 years. This recommendation follows ACR consensus guidelines: White Paper of the ACR Incidental Findings Committee II on Vascular Findings. J Am Coll Radiol 2013; 10:789-794. 3. Patent superior mesenteric artery origin stent. 4. No acute thoracic aorta abnormality. 5. No central or segmental pulmonary embolus. 6. Right middle lobe passive atelectasis as well as anterior right upper lobe passive atelectasis due to a 7 x 5 cm pericardial cyst. 7. Status post right nephrectomy.     Electronically Signed   By: Iven Finn M.D.   On: 10/13/2021 16:57    Assessment:      Enlarged appendiceal tip, incidental finding noted on CT scan as noted above.  Unlikely to be contributing to his chief complaint of recent syncope.  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  Can consider elective appendectomy as an outpatient if patient wishes to proceed.  I did state that proceeding with surgery is going to be higher risk and that it may not be worth the involved risk.  Patient would like to discuss in more detail as an outpatient when ready to do so.  Surgery will sign off at this time.  Please call with additional questions or concerns.     labs/images/medications/previous chart entries reviewed personally  and relevant changes/updates noted above.

## 2021-10-13 NOTE — H&P (Signed)
ADMISSION HISTORY AND PHYSICAL   Bradley Ellis ZHY:[id number] DOB: 1940/12/06 DOA: 10/13/2021  PCP: Azucena Cecil, MD Patient coming from: home via EMS to [facility] ED  Chief Complaint: syncope w/ hypotension   HPI:  81 year old with a history of COPD, peptic ulcer disease, paroxysmal atrial fibrillation, AAA status post endovascular repair, HTN, HLD, who presented to the ED via EMS after suffering a syncopal spell.  The patient stated that for the past 2 to 3 days he has noted "excessive watering from both eyes" without erythema or significant irritation and a sensation of having a very dry mouth.  He reports that he has chronic loose stools dating back "over 40 years because I have tropical sprue."  He actually was accompanying his wife to her doctor's visit today and apparently became unconscious while in a seated position in the doctor's office.  He denies any specific prodromal symptoms but admits that he was feeling lethargic in general.  He stated he has not been eating much lately because he has had difficulty acquiring groceries.  At presentation he was found to have blood pressure of 76/49 with a heart rate of 72.  He is feeling better after volume resuscitation in the ER.  He denies chest pressure or shortness of breath fevers or chills.  He denies nausea or vomiting.  Assessment/Plan  Syncope Most likely simply related to volume depletion due to poor intake and chronic elevated GI loss - underwent hospitalization and evaluation for same March 2021 at [facility] -no worrisome features -we will continue to hydrate and monitor overnight under observation status -check orthostatic vitals in a.m. -avoid more extensive eval at this time -if clinically much improved with simple volume resuscitation will likely be a candidate for discharge home 6/9  Hypokalemia Due to poor intake and probable chronic GI loss -supplement -recheck in a.m. 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   Past Medical History:  Diagnosis Date   [place]    [place] from [place]   Depression 1961   no major exacerbations since   GERD (gastroesophageal reflux disease)    Hyperlipidemia    Hypertension    Laryngeal nodule    benign   Sleep disorder     Past Surgical History:  Procedure Laterality Date   BREAST CYST EXCISION Bilateral    also in groin and by rectum at other times (7)   CERVICAL DISCECTOMY  12952,[id number]  ELBOW ARTHROPLASTY Left 1962   KNEE ARTHROSCOPY Left    2 arthroscopy. Another surgery due to infection (  cactus needle)   NEPHRECTOMY Right 1998   for cancer. Done at [facility]. No other Rx   PROSTATE SURGERY     about 15 yrs ago   SKIN CANCER EXCISION     several in various places   [place]    Family History  Family History  Problem  Relation Age of Onset   Cancer Mother        breast cancer   Heart disease Sister    Stroke Sister    Diabetes Sister    Diabetes Maternal Grandmother     Social History   reports that he has never smoked. He has never used smokeless tobacco. He reports that he does not drink alcohol and does not use drugs.  Allergies Allergies  Allergen Reactions   Sulfa Antibiotics Rash and Hives   Gabapentin Other (See Comments)    Dizziness    Prior to Admission medications   Medication Sig Start Date End Date Taking? Authorizing Provider  albuterol (VENTOLIN HFA) 108 (90 Base) MCG/ACT inhaler Inhale 2 puffs into the lungs every 6 (six) hours as needed for shortness of breath. 07/22/21   Menshew, Dannielle Karvonen, PA-C  aspirin EC 81 MG tablet Take 81 mg by mouth daily.    [provider]  atorvastatin (LIPITOR) 10 MG tablet Take 1 tablet (10 mg total) by mouth daily. 08/15/19   Steele Sizer, MD  atorvastatin (LIPITOR) 10 MG tablet Take by mouth. 02/18/20   [provider]  atorvastatin (LIPITOR) 80 MG tablet Take 80 mg by mouth daily. 01/25/21   [provider]  benzonatate (TESSALON PERLES) 100 MG capsule Take 1-2 tabs TID prn cough 07/22/21   Menshew, Dannielle Karvonen, PA-C  carboxymethylcellulose (REFRESH PLUS) 0.5 % SOLN Place 1 drop into both eyes 3 (three) times daily as needed.    [provider]  clopidogrel (PLAVIX) 75 MG tablet  02/14/20   [provider]  ELIQUIS 5 MG TABS tablet Take 5 mg by mouth 2 (two) times daily. 06/09/21   [provider]  famotidine (PEPCID) 20 MG tablet Take 20 mg by mouth 2 (two) times daily. 06/09/21   [provider]  furosemide (LASIX) 40 MG tablet Take 40 mg by mouth as needed for fluid.    [provider]  gabapentin (NEURONTIN) 300 MG capsule Take 1 capsule (300 mg total) by mouth at bedtime. 08/15/19   Steele Sizer, MD  irbesartan-hydrochlorothiazide (AVALIDE) 300-12.5 MG tablet Take 1  tablet by mouth once daily 02/06/20   Steele Sizer, MD  isosorbide mononitrate (IMDUR) 30 MG 24 hr tablet Take 90 mg by mouth every morning. 06/09/21   [provider]  lidocaine (LIDODERM) 5 % 1 patch daily. 12/25/19   [provider]  loperamide (IMODIUM A-D) 2 MG tablet Take 2 mg by mouth 4 (four) times daily as needed for diarrhea or loose stools.    [provider]  loperamide (IMODIUM) 2 MG capsule  02/20/20   [provider]  loratadine (CLARITIN) 10 MG tablet Take 10 mg by mouth daily.    [provider]  metoprolol tartrate (LOPRESSOR) 50 MG tablet Take 75 mg by mouth 2 (two) times daily. 06/09/21   [provider]  Niacin (VITAMIN B-3 PO) Take 1 tablet by mouth daily. VITAL FUERTE H3 '100MG'$  CAPSULE    [provider]  nitroGLYCERIN (NITROSTAT) 0.4 MG SL tablet SMARTSIG:1 Tablet(s) Sublingual PRN 02/21/21   [provider]  pantoprazole (PROTONIX) 40 MG tablet Take 1 tablet by mouth once daily 02/17/20   Steele Sizer, MD  rOPINIRole (REQUIP) 0.25 MG tablet Take 1 tablet by mouth at bedtime. 07/04/21   [provider]  tiZANidine (ZANAFLEX) 4 MG tablet TAKE 1 TABLET BY MOUTH THREE TIMES DAILY AS NEEDED FOR MUSCLE PAIN 01/30/20   [provider]  traZODone (DESYREL) 50 MG tablet TAKE 1 TABLET BY MOUTH AT BEDTIME 11/05/19   Sowles, Drue Stager, MD  Yohimbe Bark 500 MG CAPS Take 1 capsule by mouth 2 (two) times daily.    [provider]    Physical Exam: Vitals:   10/13/21 1500 10/13/21 1530 10/13/21 1648 10/13/21 1700  BP: 108/66 115/74 123/81 125/85  Pulse: 73 70 72 90  Resp: (!) '22  15 19  '$ Temp:      TempSrc:      SpO2: 98% 97% 98% 94%    Constitutional: NAD, calm, comfortable Eyes: PERRL, lids and conjunctivae normal ENMT: Mucous membranes are dry. Posterior pharynx clear of any exudate or lesions.  Neck: normal, supple, no masses, no thyromegaly Respiratory: clear to auscultation  bilaterally, no wheezing, no crackles. Normal respiratory effort. No accessory muscle use.  Cardiovascular: Regular rate and rhythm, no murmurs / rubs / gallops. No extremity edema. 2+ pedal pulses.  Abdomen: No tenderness or masses to palpation. No hepatosplenomegaly. Bowel sounds positive. Not distended. Soft.  Musculoskeletal: No clubbing / cyanosis. No joint deformity upper and lower extremities. No contractures. Normal muscle tone.  Skin: No rashes, lesions, ulcers.  Poor skin turgor. Neurologic: CN 2-12 grossly intact B. Sensation intact. Strength 5/5 in all 4 extremities.  Psychiatric: Normal judgment and insight. Alert and oriented x 3. Normal mood.    Labs on Admission:   CBC: Recent Labs  Lab 10/13/21 1412  WBC 8.9  HGB 11.9*  HCT 38.8*  MCV 91.7  PLT 161   Basic Metabolic Panel: Recent Labs  Lab 10/13/21 1412  NA 141  K 3.1*  CL 111  CO2 27  GLUCOSE 101*  BUN 15  CREATININE 0.98  CALCIUM 7.9*   GFR: CrCl cannot be calculated (Unknown ideal weight.).  Liver Function Tests: Recent Labs  Lab 10/13/21 1601  AST 18  ALT 11  ALKPHOS 65  BILITOT 0.6  PROT 6.0*  ALBUMIN 3.3*   Recent Labs  Lab 10/13/21 1601  LIPASE 37   Urine analysis:    Component Value Date/Time   COLORURINE YELLOW (A) 10/13/2021 1601   APPEARANCEUR CLEAR (A) 10/13/2021 1601   LABSPEC 1.021 10/13/2021 1601   PHURINE 5.0 10/13/2021 1601   GLUCOSEU NEGATIVE 10/13/2021 1601   HGBUR NEGATIVE 10/13/2021 1601   BILIRUBINUR NEGATIVE 10/13/2021 1601   KETONESUR NEGATIVE 10/13/2021 1601   PROTEINUR NEGATIVE 10/13/2021 1601   NITRITE NEGATIVE 10/13/2021 1601   LEUKOCYTESUR NEGATIVE 10/13/2021 1601     Radiological Exams on Admission: CT Angio Chest/Abd/Pel for Dissection W and/or Wo Contrast  Result Date: 10/13/2021 CLINICAL DATA:  Aortic aneurysm, known or suspected.  Syncope EXAM: CT ANGIOGRAPHY CHEST, ABDOMEN AND PELVIS TECHNIQUE: Non-contrast CT of the chest was initially  obtained. Multidetector CT imaging through the chest, abdomen and pelvis was performed using the standard protocol during bolus administration of intravenous contrast. Multiplanar reconstructed images and MIPs were obtained and reviewed to evaluate the vascular anatomy. RADIATION DOSE REDUCTION: This exam was performed according to the departmental dose-optimization program which includes automated exposure control, adjustment of the mA and/or kV according to patient size and/or use  of iterative reconstruction technique. CONTRAST:  162m OMNIPAQUE IOHEXOL 350 MG/ML SOLN COMPARISON:  CT angiography chest 07/22/2021, MRI abdomen 06/25/2018 FINDINGS: CTA CHEST FINDINGS Cardiovascular: Preferential opacification of the thoracic aorta. No evidence of thoracic aortic aneurysm or dissection. Normal heart size. No pericardial effusion. No central or segmental pulmonary embolus. The main pulmonary artery measures at the upper limits of normal. Mediastinum/Nodes: Similar-appearing fluid density 7 x 5 cm lesion within the cardio phrenic angle likely representing a pericardial cyst. No enlarged mediastinal, hilar, or axillary lymph nodes. Thyroid gland, trachea, and esophagus demonstrate no significant findings. Lungs/Pleura: Right middle lobe passive atelectasis as well as anterior right upper lobe passive atelectasis due to a large pericardial cyst. No focal consolidation. Couple scattered calcified and noncalcified pulmonary micronodules. No new pulmonary nodule. No pulmonary mass. No pleural effusion. No pneumothorax. Musculoskeletal: No chest wall abnormality. No suspicious lytic or blastic osseous lesions. No acute displaced fracture. Review of the MIP images confirms the above findings. CTA ABDOMEN AND PELVIS FINDINGS VASCULAR Aorta: Interval increase in caliber of an infrarenal abdominal aorta measuring up to 3.5 x 3.7 cm (from 3.3 x 3.1 cm) and extending 5 cm in the craniocaudal dimension. No aortic fat stranding or  draping over the lumbar spine. Atherosclerotic plaque. No dissection, vasculitis or significant stenosis. Celiac: Atherosclerotic plaque at the origin. Patent without evidence of aneurysm, dissection, vasculitis or significant stenosis. SMA: Patent without evidence of aneurysm, dissection, vasculitis or significant stenosis. Renals: Both renal arteries are patent without evidence of aneurysm, dissection, vasculitis, fibromuscular dysplasia or significant stenosis. IMA: Atherosclerotic plaque at the origin. Patent without evidence of aneurysm, dissection, vasculitis or significant stenosis. Inflow: Mild atherosclerotic plaque. Patent without evidence of aneurysm, dissection, vasculitis or significant stenosis. Veins: No obvious venous abnormality within the limitations of this arterial phase study. Review of the MIP images confirms the above findings. NON-VASCULAR Hepatobiliary: No focal liver abnormality. No gallstones, gallbladder wall thickening, or pericholecystic fluid. No biliary dilatation. Pancreas: No focal lesion. Normal pancreatic contour. No surrounding inflammatory changes. No main pancreatic ductal dilatation. Spleen: Normal in size without focal abnormality. Adrenals/Urinary Tract: No adrenal nodule on the left. The right adrenal gland may be surgically absent. Left kidney enhances homogeneous lead. Status post right nephrectomy. No hydronephrosis. No hydroureter. The urinary bladder is unremarkable. Stomach/Bowel: Stomach is within normal limits. No evidence of bowel wall thickening or dilatation. Enlarged appendiceal tip with query mild periappendiceal fat stranding. Lymphatic: No lymphadenopathy. Reproductive: Prostate is unremarkable. Other: No intraperitoneal free fluid. No intraperitoneal free gas. No organized fluid collection. Musculoskeletal: No abdominal wall hernia or abnormality. No suspicious lytic or blastic osseous lesions. No acute displaced fracture. Review of the MIP images confirms  the above findings. IMPRESSION: 1. Enlarged appendiceal tip (13 mm). No associated inflammatory changes to suggest acute appendicitis. Finding concerning for appendiceal mucocele or malignancy. Recommend surgical consultation for appendectomy. 2. Interval increase in caliber of an infrarenal abdominal aorta measuring up to 3.5 x 3.7 cm (from 3.3 x 3.1 cm) and extending 5 cm in the craniocaudal dimension. Recommend follow-up ultrasound every 2 years. This recommendation follows ACR consensus guidelines: White Paper of the ACR Incidental Findings Committee II on Vascular Findings. J Am Coll Radiol 2013; 10:789-794. 3. Patent superior mesenteric artery origin stent. 4. No acute thoracic aorta abnormality. 5. No central or segmental pulmonary embolus. 6. Right middle lobe passive atelectasis as well as anterior right upper lobe passive atelectasis due to a 7 x 5 cm pericardial cyst. 7. Status post right nephrectomy. Electronically  Signed   By: Iven Finn M.D.   On: 10/13/2021 16:57   DG Chest Portable 1 View  Result Date: 10/13/2021 CLINICAL DATA:  Syncope. EXAM: PORTABLE CHEST 1 VIEW COMPARISON:  07/22/2021 CT, chest radiograph and prior studies. FINDINGS: Previously identified large RIGHT pericardial cyst appear slightly increased in size since 07/22/2021. The cardiomediastinal silhouette is otherwise unchanged. Minimal bibasilar atelectasis is present. No definite airspace disease, pleural effusion or pneumothorax noted. No acute bony abnormalities are identified. IMPRESSION: 1. Possible slightly increased size of large RIGHT pericardial cyst since 07/22/2021. 2. Minimal bibasilar atelectasis. Electronically Signed   By: Margarette Canada M.D.   On: 10/13/2021 15:54   CT HEAD WO CONTRAST  Result Date: 10/13/2021 CLINICAL DATA:  Syncope EXAM: CT HEAD WITHOUT CONTRAST TECHNIQUE: Contiguous axial images were obtained from the base of the skull through the vertex without intravenous contrast. RADIATION DOSE  REDUCTION: This exam was performed according to the departmental dose-optimization program which includes automated exposure control, adjustment of the mA and/or kV according to patient size and/or use of iterative reconstruction technique. COMPARISON:  None Available. FINDINGS: Brain: No acute intracranial hemorrhage. No focal mass lesion. No CT evidence of acute infarction. No midline shift or mass effect. No hydrocephalus. Basilar cisterns are patent. Minimal periventricular and subcortical white matter hypodensities. Minimal generalized cortical atrophy. Vascular: No hyperdense vessel or unexpected calcification. Skull: Normal. Negative for fracture or focal lesion. Sinuses/Orbits: Paranasal sinuses and mastoid air cells are clear. Orbits are clear. Other: None. IMPRESSION: 1. No acute intracranial findings. 2. Minimal atrophy and white matter mass vascular disease Electronically Signed   By: Suzy Bouchard M.D.   On: 10/13/2021 14:51     Cherene Altes, MD [facility] Office  [phone] Pager - Text Page per Amion as per below:  On-Call/Text Page:      [email or web address]  If 7PM-7AM, please contact night-coverage [email or web address] 10/13/2021, 5:36 PM

## 2021-10-13 NOTE — ED Triage Notes (Signed)
Pt comes into the ED via EMS from [facility] , was there with his wife that was the pt and had a syncopal episode, unwitnessed, a/ox4 Denies any pain, on eliquis. 96/56 #20gRhand 76/49 after 381m NS HR 72 93%RA

## 2021-10-13 NOTE — ED Provider Notes (Signed)
[facility] Provider Note    Event Date/Time   First MD Initiated Contact with Patient 10/13/21 1506     (approximate)   History   Loss of Consciousness   HPI  Bradley Ellis is a 81 y.o. male history of bronchitis and A-fib on Eliquis as well as history of AAA status post endovascular repair presents to the ER for evaluation of syncopal episode that occurred at clinical office today.  States he has been having some abdominal pain and poor p.o. intake over the past few days.  Feels like his mouth is dry and that has been dehydrated.  Denies any chest pain or pressure no shortness of breath.  Denies any melena no hematochezia.     Physical Exam   Triage Vital Signs: ED Triage Vitals [10/13/21 1409]  Enc Vitals Group     BP 103/75     Pulse Rate 68     Resp 18     Temp 97.7 F (36.5 C)     Temp Source Oral     SpO2 97 %     Weight      Height      Head Circumference      Peak Flow      Pain Score      Pain Loc      Pain Edu?      Excl. in [place]?     Most recent vital signs: Vitals:   10/13/21 1730 10/13/21 1800  BP: 124/83 (!) 109/94  Pulse: 73   Resp: 16 (!) 21  Temp:    SpO2: 95%      Constitutional: Alert  Eyes: Conjunctivae are normal.  Head: Atraumatic. Nose: No congestion/rhinnorhea. Mouth/Throat: Mucous membranes are moist.   Neck: Painless ROM.  Cardiovascular:   Good peripheral circulation. Respiratory: Normal respiratory effort.  No retractions.  Gastrointestinal: Soft, ttp in epigastric region  Musculoskeletal:  no deformity Neurologic:  MAE spontaneously. No gross focal neurologic deficits are appreciated.  Skin:  Skin is warm, dry and intact. No rash noted. Psychiatric: Mood and affect are normal. Speech and behavior are normal.    ED Results / Procedures / Treatments   Labs (all labs ordered are listed, but only abnormal results are displayed) Labs Reviewed  BASIC METABOLIC PANEL - Abnormal; Notable for the  following components:      Result Value   Potassium 3.1 (*)    Glucose, Bld 101 (*)    Calcium 7.9 (*)    Anion gap 3 (*)    All other components within normal limits  CBC - Abnormal; Notable for the following components:   Hemoglobin 11.9 (*)    HCT 38.8 (*)    All other components within normal limits  URINALYSIS, ROUTINE W REFLEX MICROSCOPIC - Abnormal; Notable for the following components:   Color, Urine YELLOW (*)    APPearance CLEAR (*)    All other components within normal limits  HEPATIC FUNCTION PANEL - Abnormal; Notable for the following components:   Total Protein 6.0 (*)    Albumin 3.3 (*)    All other components within normal limits  TROPONIN I (HIGH SENSITIVITY) - Abnormal; Notable for the following components:   Troponin I (High Sensitivity) 40 (*)    All other components within normal limits  LIPASE, BLOOD  CBG MONITORING, ED  TYPE AND SCREEN  TROPONIN I (HIGH SENSITIVITY)     EKG ED ECG REPORT I, Merlyn Lot, the attending physician, personally viewed and  interpreted this ECG.   Date: 10/13/2021  EKG Time: 14:11  Rate: 70  Rhythm: sinus  Axis: normal  Intervals: normal  ST&T Change: nonspecific st abn unchanged from previous ekg 07/25/21     RADIOLOGY Please see ED Course for my review and interpretation.  I personally reviewed all radiographic images ordered to evaluate for the above acute complaints and reviewed radiology reports and findings.  These findings were personally discussed with the patient.  Please see medical record for radiology report.    PROCEDURES:  Critical Care performed: No  Procedures   MEDICATIONS ORDERED IN ED: Medications  albuterol (PROVENTIL) (2.5 MG/3ML) 0.083% nebulizer solution 3 mL (has no administration in time range)  atorvastatin (LIPITOR) tablet 80 mg (has no administration in time range)  benzonatate (TESSALON) capsule 100 mg (has no administration in time range)  carboxymethylcellulose (REFRESH  PLUS) 0.5 % ophthalmic solution 1 drop (has no administration in time range)  apixaban (ELIQUIS) tablet 5 mg (has no administration in time range)  famotidine (PEPCID) tablet 20 mg (has no administration in time range)  aspirin EC tablet 81 mg (has no administration in time range)  gabapentin (NEURONTIN) capsule 300 mg (has no administration in time range)  loperamide (IMODIUM A-D) tablet 2 mg (has no administration in time range)  metoprolol tartrate (LOPRESSOR) tablet 75 mg (has no administration in time range)  pantoprazole (PROTONIX) EC tablet 40 mg (has no administration in time range)  rOPINIRole (REQUIP) tablet 0.25 mg (has no administration in time range)  traZODone (DESYREL) tablet 50 mg (has no administration in time range)  potassium chloride SA (KLOR-CON M) CR tablet 40 mEq (has no administration in time range)  sodium chloride 0.9 % bolus 500 mL (0 mLs Intravenous Stopped 10/13/21 1757)  iohexol (OMNIPAQUE) 350 MG/ML injection 100 mL (100 mLs Intravenous Contrast Given 10/13/21 1627)     IMPRESSION / MDM / ASSESSMENT AND PLAN / ED COURSE  I reviewed the triage vital signs and the nursing notes.                              Differential diagnosis includes, but is not limited to, AAA, endoleak, mesenteric ischemia, dehydration, electrolyte abnormality, dysrhythmia, medication complication, GI bleed, sepsis, ACS  Patient presented to the ER for evaluation of symptoms as described above after syncopal episode.  Given his PMH, this presenting complaint could reflect a potentially life-threatening illness therefore the patient will be placed on continuous pulse oximetry and telemetry for monitoring.  Laboratory evaluation will be sent to evaluate for the above complaints.       Clinical Course as of 10/13/21 1816  Thu Oct 13, 2021  1520 CT head on my interpretation does not show any evidence of acute intracranial bleed. [PR]  1856 CTA by my interpretation does not show any evidence  of dissection.  Will await formal radiology report.  Initial troponin is elevated at 40. [PR]  3149 CT imaging findings discussed with patient. [PR]  1732 I discussed CT imaging findings with Dr. Lysle Pearl of general surgery.  No indication for emergent operative intervention.  I discussed case in consultation with hospitalist who agrees to admit patient to their service for further work-up. [PR]    Clinical Course User Index [PR] Merlyn Lot, MD    Patient's presentation is most consistent with acute presentation with potential threat to life or bodily function.   FINAL CLINICAL IMPRESSION(S) / ED DIAGNOSES   Final  diagnoses:  Syncope and collapse     Rx / DC Orders   ED Discharge Orders     None        Note:  This document was prepared using Dragon voice recognition software and may include unintentional dictation errors.    Merlyn Lot, MD 10/13/21 1816

## 2021-10-14 DIAGNOSIS — R55 Syncope and collapse: Secondary | ICD-10-CM | POA: Diagnosis not present

## 2021-10-14 LAB — IRON AND TIBC
Iron: 24 ug/dL — ABNORMAL LOW (ref 45–182)
Saturation Ratios: 8 % — ABNORMAL LOW (ref 17.9–39.5)
TIBC: 295 ug/dL (ref 250–450)
UIBC: 271 ug/dL

## 2021-10-14 LAB — COMPREHENSIVE METABOLIC PANEL
ALT: 9 U/L (ref 0–44)
AST: 16 U/L (ref 15–41)
Albumin: 3.1 g/dL — ABNORMAL LOW (ref 3.5–5.0)
Alkaline Phosphatase: 63 U/L (ref 38–126)
Anion gap: 3 — ABNORMAL LOW (ref 5–15)
BUN: 17 mg/dL (ref 8–23)
CO2: 30 mmol/L (ref 22–32)
Calcium: 7.7 mg/dL — ABNORMAL LOW (ref 8.9–10.3)
Chloride: 111 mmol/L (ref 98–111)
Creatinine, Ser: 1.02 mg/dL (ref 0.61–1.24)
GFR, Estimated: >60 mL/min (ref >60)
Glucose, Bld: 102 mg/dL — ABNORMAL HIGH (ref 70–99)
Potassium: 3.1 mmol/L — ABNORMAL LOW (ref 3.5–5.1)
Sodium: 144 mmol/L (ref 135–145)
Total Bilirubin: 0.4 mg/dL (ref 0.3–1.2)
Total Protein: 5.6 g/dL — ABNORMAL LOW (ref 6.5–8.1)

## 2021-10-14 LAB — MAGNESIUM: Magnesium: 1.9 mg/dL (ref 1.7–2.4)

## 2021-10-14 LAB — CBC
HCT: 36.8 % — ABNORMAL LOW (ref 39.0–52.0)
Hemoglobin: 11.2 g/dL — ABNORMAL LOW (ref 13.0–17.0)
MCH: 27.8 pg (ref 26.0–34.0)
MCHC: 30.4 g/dL (ref 30.0–36.0)
MCV: 91.3 fL (ref 80.0–100.0)
Platelets: 198 10*3/uL (ref 150–400)
RBC: 4.03 MIL/uL — ABNORMAL LOW (ref 4.22–5.81)
RDW: 14.4 % (ref 11.5–15.5)
WBC: 6.3 10*3/uL (ref 4.0–10.5)
nRBC: 0 % (ref 0.0–0.2)

## 2021-10-14 LAB — RETICULOCYTES
Immature Retic Fract: 22.7 % — ABNORMAL HIGH (ref 2.3–15.9)
RBC.: 4 MIL/uL — ABNORMAL LOW (ref 4.22–5.81)
Retic Count, Absolute: 66.4 10*3/uL (ref 19.0–186.0)
Retic Ct Pct: 1.7 % (ref 0.4–3.1)

## 2021-10-14 LAB — FERRITIN: Ferritin: 27 ng/mL (ref 24–336)

## 2021-10-14 LAB — FOLATE: Folate: 6.7 ng/mL (ref >5.9)

## 2021-10-14 LAB — VITAMIN B12: Vitamin B-12: 313 pg/mL (ref 180–914)

## 2021-10-14 MED ORDER — IRON (FERROUS SULFATE) 325 (65 FE) MG PO TABS: 325.0000 mg | ORAL_TABLET | Freq: Two times a day (BID) | ORAL | Status: AC

## 2021-10-14 MED ORDER — POTASSIUM CHLORIDE CRYS ER 20 MEQ PO TBCR
40.0000 meq | EXTENDED_RELEASE_TABLET | Freq: Once | ORAL | Status: AC
  Administered 2021-10-14: 40 meq via ORAL
  Filled 2021-10-14: qty 2

## 2021-10-14 MED ORDER — METOPROLOL TARTRATE 25 MG PO TABS
75.0000 mg | ORAL_TABLET | Freq: Two times a day (BID) | ORAL | Status: DC
  Administered 2021-10-14: 75 mg via ORAL
  Filled 2021-10-14: qty 3

## 2021-10-14 MED ORDER — POTASSIUM CHLORIDE CRYS ER 20 MEQ PO TBCR
20.0000 meq | EXTENDED_RELEASE_TABLET | Freq: Every day | ORAL | 0 refills | Status: AC
Start: 2021-10-14 — End: 2021-10-21

## 2021-10-14 NOTE — Discharge Summary (Signed)
DISCHARGE SUMMARY  Bradley Ellis  MR#: [id number]  DOB:August 25, 1940  Date of Admission: 10/13/2021 Date of Discharge: 10/14/2021  Attending Physician:Bradley Mcallister Hennie Duos, MD  Patient's Bradley Ellis, Bradley Cruise, MD   Consults: General Surgery  Disposition: Discharge home  Follow-up Appts:  Follow-up Information     Snowflake, Isami, DO. Call.   Specialty: Surgery Why: As needed to discuss possible surgery for enlarged appendix Contact information: [place] [address] [phone]         Bradley Cecil, MD Follow up in 1 week(s).   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  The patient stated that for the past 2 to 3 days he has noted "excessive watering from both eyes" without erythema or significant irritation and a sensation of having a very dry mouth.  He reports that he has chronic loose stools dating back "over 40 years because I have tropical sprue."  He actually was accompanying his wife to her doctor's visit today and apparently became unconscious while in a seated position in the doctor's office.  He denies any  specific prodromal symptoms but admits that he was feeling lethargic in general.  He stated he has not been eating much lately because he has had difficulty acquiring groceries.  At presentation he was found to have blood pressure of 76/49 with a heart rate of 72.  He is feeling better after volume resuscitation in the ER.  He denies chest pressure or shortness of breath fevers or chills.  He denies nausea or vomiting.  Hospital Course:  Syncope Most likely simply related to volume depletion due to poor intake and chronic elevated GI loss - underwent hospitalization and evaluation for same March 2021 [facility]-no worrisome features this admission - clinically much improved with simple volume resuscitation    Hypokalemia Due to poor intake as well as use of diuretic and probable chronic GI loss -magnesium is normal -continue to supplement for 7 days and recheck in outpatient setting   Normocytic anemia Anemia panel consistent with iron deficiency as well as a probable multifactorial etiology to include poor nutritional state - discussed need for iron supplementation and updating screening colonoscopy as outpatient - patient voiced understanding -hemoglobin stable at time of discharge accounting for hydration   Known pericardial cyst [facility]  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 Despite his history of chronic angina he specifically deniesdany substernal chest pressure/pain this admission   Chronic infrarenal AAA Continue to monitor in outpatient setting   Status post remote partial lobectomy By history reportedly related to his nephrectomy   Mild memory disturbance   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 Followed by vascular surgery -no acute complications this admission   Chronic bilateral lower extremity neuropathy  Remote history right renal cell carcinoma Status post resection of right kidney   Incidentally noted  enlarged appendiceal tip (59m) Found on CTA chest -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 -was evaluated by general surgery who offered outpatient follow-up for further discussion and consideration of intervention  Paroxysmal atrial fibrillation Continue usual beta-blocker and Eliquis without change    Allergies as of 10/14/2021       Reactions   Sulfa Antibiotics Rash, Hives   Gabapentin Other (See Comments)   Dizziness Patient can tolerate sparingly and only as needed        Medication List     STOP taking these medications    albuterol 108 (90 Base) MCG/ACT inhaler Commonly known as: VENTOLIN HFA   aspirin EC 81 MG tablet   benzonatate 100 MG capsule Commonly known as: Tessalon Perles   rOPINIRole 0.25 MG tablet Commonly known as: REQUIP       TAKE these medications    atorvastatin 80 MG tablet Commonly known as: LIPITOR Take 80 mg by mouth daily.   carboxymethylcellulose 0.5 % Soln Commonly known as: REFRESH PLUS Place 1 drop into both eyes 3 (three) times daily as needed.   cyclobenzaprine 10 MG tablet Commonly known as: FLEXERIL Take 10 mg by mouth 3 (three) times daily as needed for muscle spasms.   Eliquis 5 MG Tabs tablet Generic drug: apixaban Take 5 mg by mouth 2 (two) times daily.   famotidine 20 MG tablet Commonly known as: PEPCID Take 20 mg by mouth 2 (two) times daily.   gabapentin 300 MG capsule Commonly known as: NEURONTIN Take 1 capsule (300 mg total) by mouth at bedtime.   irbesartan-hydrochlorothiazide 300-12.5 MG tablet Commonly known as: AVALIDE Take 1 tablet by mouth once daily   Iron (Ferrous Sulfate) 325 (65 Fe) MG Tabs Take 325 mg by mouth 2 (two) times daily.   isosorbide mononitrate 30 MG 24 hr tablet Commonly known as: IMDUR Take 90 mg by mouth every morning.   loperamide 2 MG capsule Commonly known as: IMODIUM   metoprolol tartrate 50 MG tablet Commonly known as: LOPRESSOR Take 75 mg by  mouth 2 (two) times daily.   nitroGLYCERIN 0.4 MG SL tablet Commonly known as: NITROSTAT SMARTSIG:1 Tablet(s) Sublingual PRN   omeprazole 20 MG capsule Commonly known as: PRILOSEC Take 20 mg by mouth daily.   potassium chloride SA 20 MEQ tablet Commonly known as: KLOR-CON M Take 1 tablet (20 mEq total) by mouth daily for 7 days.   tadalafil 20 MG tablet Commonly known as: CIALIS Take 20 mg by mouth every morning.   traZODone 100 MG tablet Commonly known as: DESYREL Take 100 mg by mouth at bedtime.        Day of Discharge BP (!) 147/77   Pulse 79   Temp 97.7 F (36.5 C) (Oral)   Resp 15   SpO2 95%   Physical Exam: General: No acute respiratory distress Lungs: Clear to auscultation bilaterally without wheezes or crackles Cardiovascular: Regular rate and rhythm without murmur gallop or rub normal S1 and S2 Abdomen: Nontender, nondistended, soft, bowel sounds positive, no rebound, no ascites, no appreciable mass Extremities: No significant cyanosis, clubbing, or edema bilateral lower extremities  Basic Metabolic Panel: Recent Labs  Lab 10/13/21 1412 10/14/21 0552  NA 141 144  K 3.1* 3.1*  CL 111 111  CO2 27 30  GLUCOSE 101* 102*  BUN 15 17  CREATININE 0.98 1.02  CALCIUM 7.9* 7.7*  MG  --  1.9    Liver Function Tests: Recent Labs  Lab 10/13/21 1601 10/14/21 0552  AST 18 16  ALT 11 9  ALKPHOS 65 63  BILITOT 0.6 0.4  PROT 6.0* 5.6*  ALBUMIN 3.3* 3.1*   CBC: Recent Labs  Lab 10/13/21 1412 10/14/21 0552  WBC 8.9 6.3  HGB 11.9* 11.2*  HCT 38.8* 36.8*  MCV 91.7 91.3  PLT 218 198    Time spent in discharge (includes decision making & examination of pt): 30 minutes  10/14/2021, 8:29 AM   Cherene Altes, MD [facility] Office  [phone]

## 2021-10-14 NOTE — ED Notes (Signed)
This RN received reports from night shift RN which included normal HR in SR. This morning pt has been having intermittent increases to 120s but it is not sustained. Pt has no complaints and is sleeping. Admission MD messaged in regard to HR increases.

## 2022-08-12 IMAGING — CT CT ANGIO CHEST
2 of 7 series · 18 of 46 positions shown · IV contrast (APPLIED)
Comparison: Plain film of earlier today.  CTA chest of 07/24/2019

CLINICAL DATA: Chest pain and shortness of breath for 1 week.
Cough. Headache. Nausea and vomiting. On blood thinners.

EXAM:
CT ANGIOGRAPHY CHEST WITH CONTRAST
TECHNIQUE: Multidetector CT imaging of the chest was performed using the
standard protocol during bolus administration of intravenous
contrast. Multiplanar CT image reconstructions and MIPs were
obtained to evaluate the vascular anatomy.

[Series 6: thins · axial · 0.65mm/px · z∈[-506,-274]mm · 15 of 374 slices shown]
[im 21/374  lung]
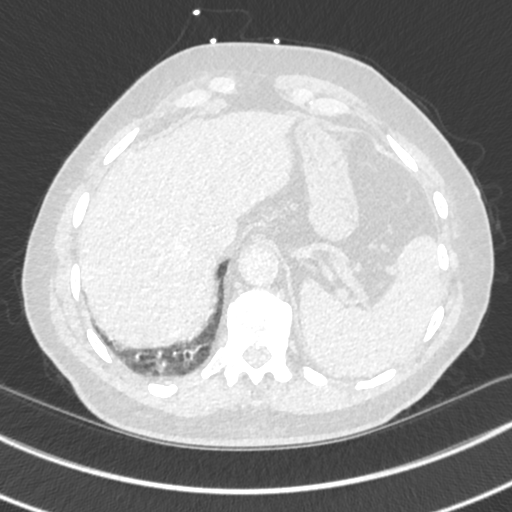
[im 42/374  soft-tissue]
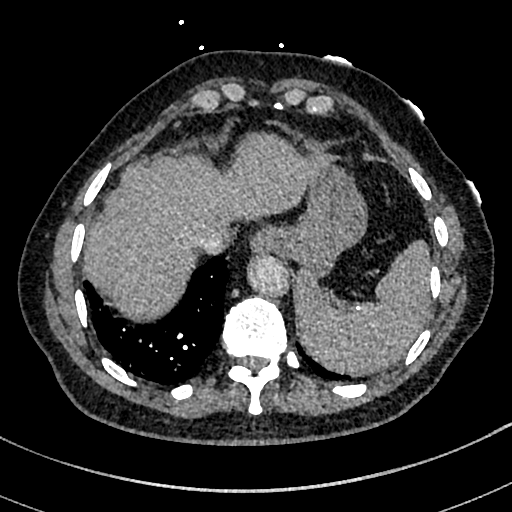
[im 63/374  lung]
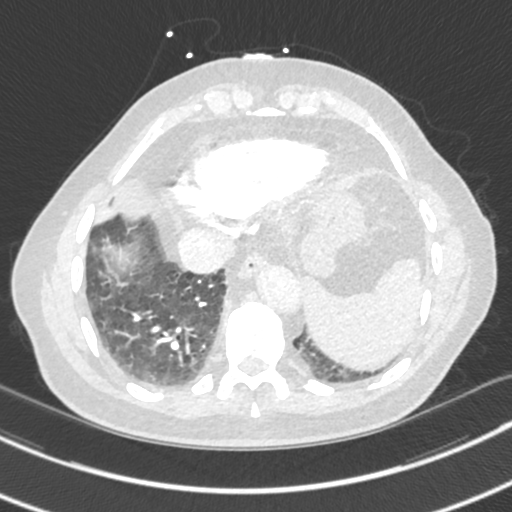
[im 83/374  soft-tissue]
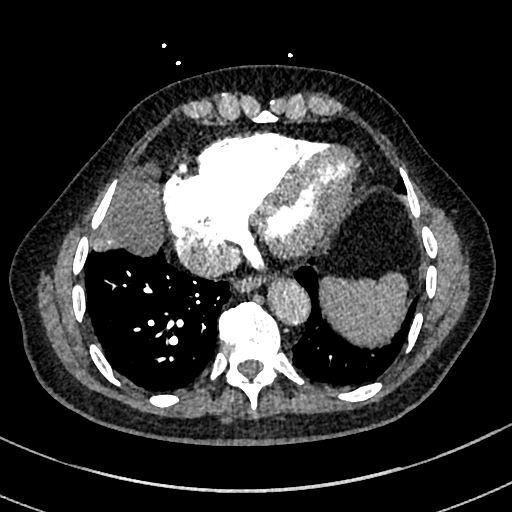
[im 125/374  lung]
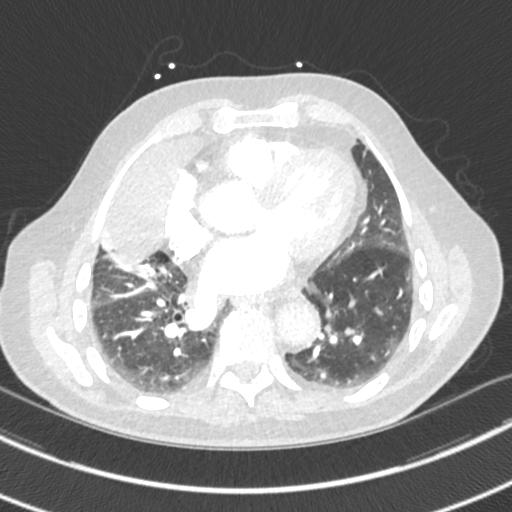
[im 146/374  soft-tissue]
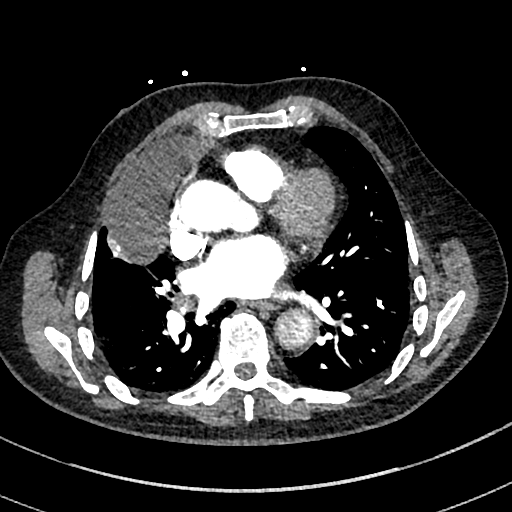
[im 166/374  lung]
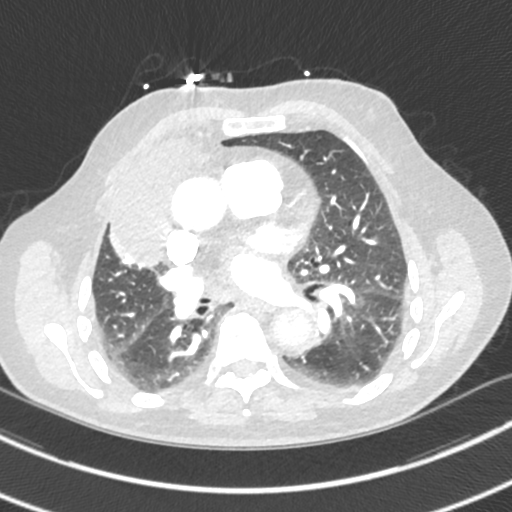
[im 187/374  soft-tissue]
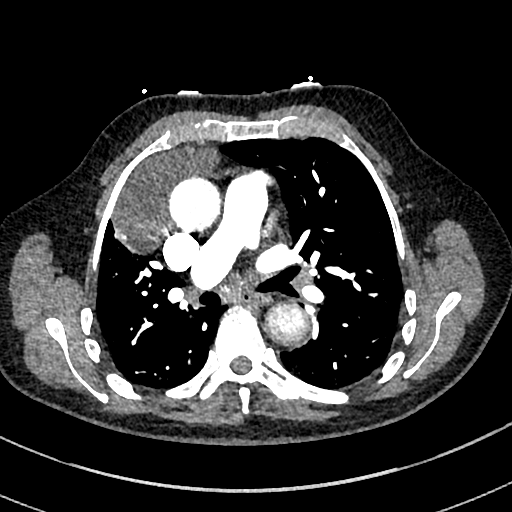
[im 208/374  lung]
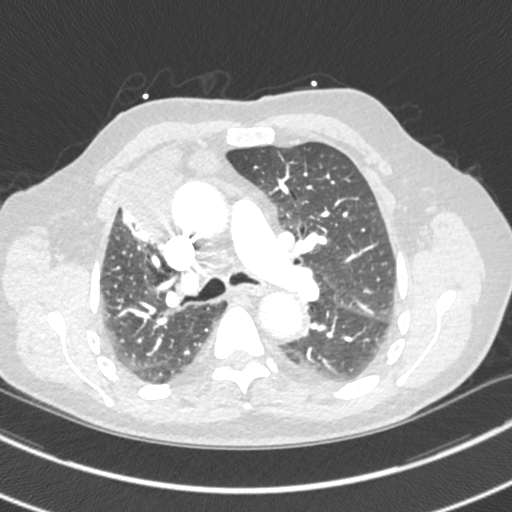
[im 228/374  soft-tissue]
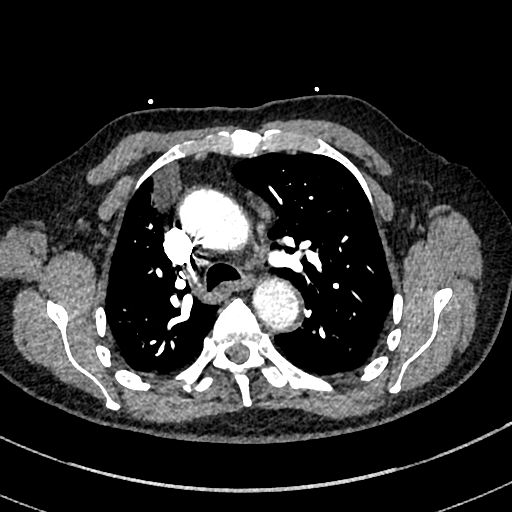
[im 249/374  lung]
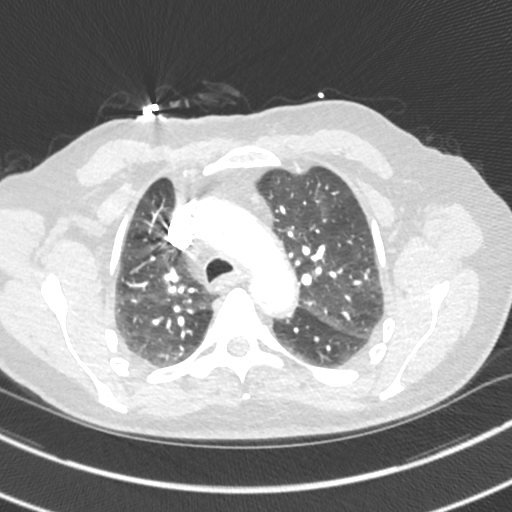
[im 291/374  soft-tissue]
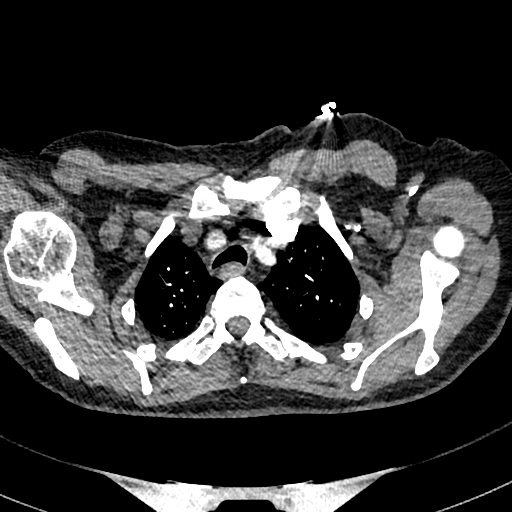
[im 311/374  lung]
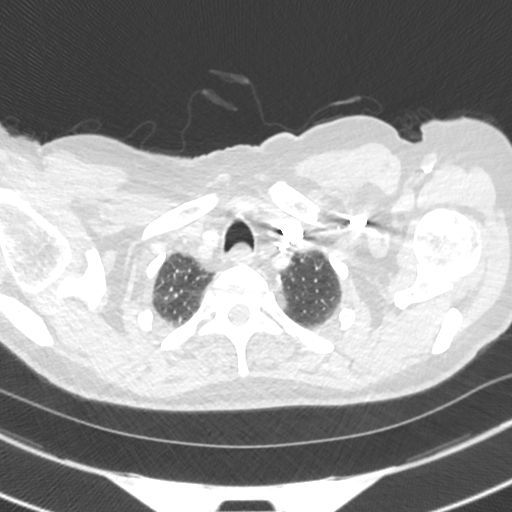
[im 332/374  soft-tissue]
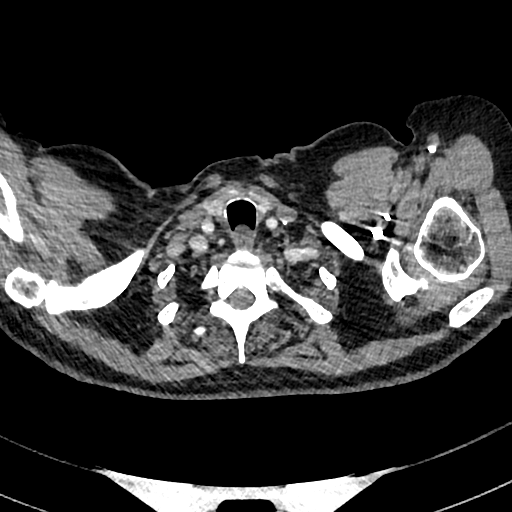
[im 353/374  lung]
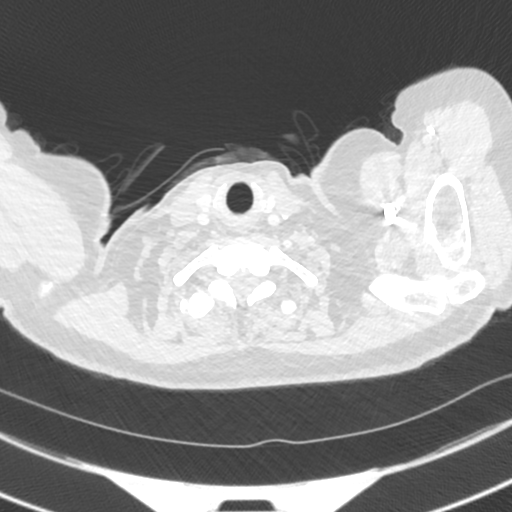

[Series 7: cor · coronal · 0.55mm/px · 3 of 135 slices shown]
[im 34/135  soft-tissue]
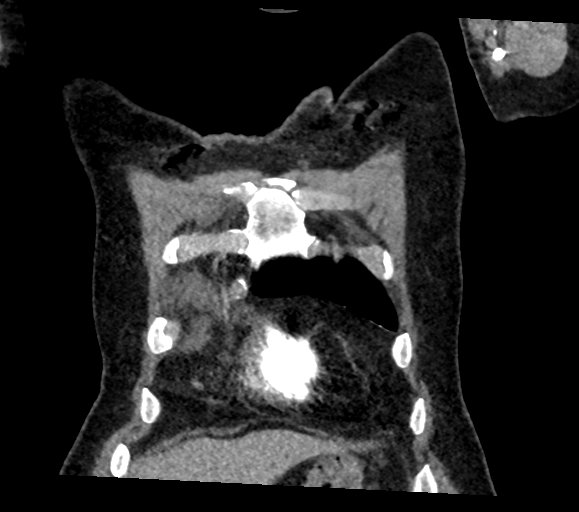
[im 68/135  soft-tissue]
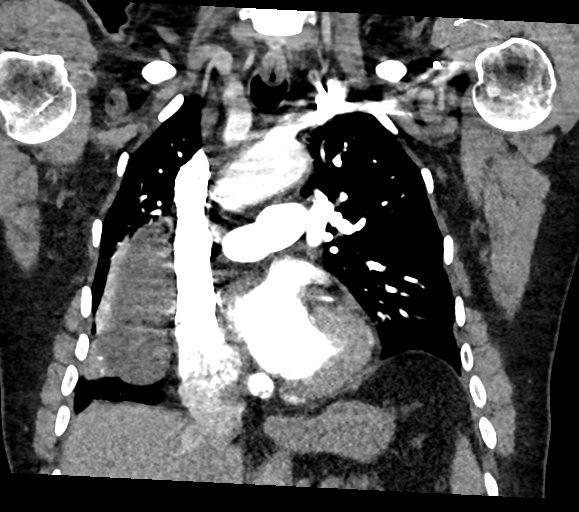
[im 101/135  soft-tissue]
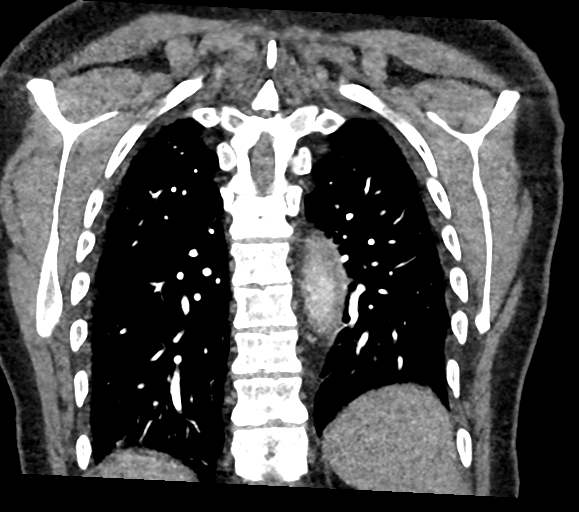

[18 of 46 positions shown; findings below may reference images not displayed]

RADIATION DOSE REDUCTION: This exam was performed according to the
departmental dose-optimization program which includes automated
exposure control, adjustment of the mA and/or kV according to
patient size and/or use of iterative reconstruction technique.

CONTRAST:  60mL OMNIPAQUE IOHEXOL 350 MG/ML SOLN
FINDINGS: Cardiovascular: The quality of this exam for evaluation of pulmonary
embolism is good. No evidence of pulmonary embolism.

Aortic atherosclerosis. Tortuous thoracic aorta. Mild cardiomegaly,
without pericardial effusion.

Mediastinum/Nodes: Prominent prevascular node is similar at 6 mm and
likely reactive. No middle mediastinal or hilar adenopathy.

Cystic lesion within the right cardiophrenic space measures on the
order of 7.9 x 3.0 cm on 83/4. Compare 6.4 x 3.6 cm on 07/24/2019
when measured in a similar fashion.

Lungs/Pleura: No pleural fluid. Right apical 3 mm nodule on [DATE] is
unchanged and considered benign.

Again identified is hypoventilation with areas of subsegmental
atelectasis at the lung bases.

Volume loss is also seen adjacent to the above-described pericardial
cyst.

Mild diffuse mosaic attenuation is favored to be related to air
trapping in the setting of hypoventilation.

Upper Abdomen: Normal imaged portions of the liver, spleen, stomach,
pancreas.

Musculoskeletal: No acute osseous abnormality.

Review of the MIP images confirms the above findings.
IMPRESSION: 1. No evidence of pulmonary embolism. No explanation for patient's
symptoms.
2. Enlargement of a cystic right cardiophrenic angle mass since
07/24/2019, most consistent with pericardial cyst.
3.  Aortic Atherosclerosis ([id number].X).

## 2022-09-06 ENCOUNTER — Other Ambulatory Visit: Payer: Self-pay | Admitting: Family Medicine

## 2022-09-06 DIAGNOSIS — S22000A Wedge compression fracture of unspecified thoracic vertebra, initial encounter for closed fracture: Secondary | ICD-10-CM

## 2022-09-06 DIAGNOSIS — S32000A Wedge compression fracture of unspecified lumbar vertebra, initial encounter for closed fracture: Secondary | ICD-10-CM

## 2022-09-07 ENCOUNTER — Encounter: Payer: Self-pay | Admitting: Adult Health
